# Patient Record
Sex: Female | Born: 1950 | Race: White | Hispanic: No | Marital: Married | Smoking: Never smoker
Health system: Southern US, Community
[De-identification: ages and names within clinical notes are randomized; demographics above are authoritative.]

## PROBLEM LIST (undated history)

## (undated) DIAGNOSIS — T7840XA Allergy, unspecified, initial encounter: Secondary | ICD-10-CM

## (undated) DIAGNOSIS — I10 Essential (primary) hypertension: Secondary | ICD-10-CM

## (undated) HISTORY — DX: Allergy, unspecified, initial encounter: T78.40XA

## (undated) HISTORY — PX: BREAST BIOPSY: SHX20

## (undated) HISTORY — DX: Essential (primary) hypertension: I10

## (undated) HISTORY — PX: OTHER SURGICAL HISTORY: SHX169

## (undated) HISTORY — PX: COLONOSCOPY: SHX174

---

## 2005-01-22 ENCOUNTER — Encounter: Admission: RE | Admit: 2005-01-22 | Discharge: 2005-01-22 | Payer: Self-pay | Admitting: Family Medicine

## 2014-10-31 ENCOUNTER — Ambulatory Visit (INDEPENDENT_AMBULATORY_CARE_PROVIDER_SITE_OTHER): Payer: BC Managed Care – PPO | Admitting: Internal Medicine

## 2014-10-31 ENCOUNTER — Encounter: Payer: Self-pay | Admitting: Internal Medicine

## 2014-10-31 VITALS — BP 110/60 | HR 66 | Temp 98.6°F | Resp 16 | Ht 64.0 in | Wt 134.6 lb

## 2014-10-31 DIAGNOSIS — Z23 Encounter for immunization: Secondary | ICD-10-CM

## 2014-10-31 DIAGNOSIS — Z299 Encounter for prophylactic measures, unspecified: Secondary | ICD-10-CM

## 2014-10-31 DIAGNOSIS — Z0000 Encounter for general adult medical examination without abnormal findings: Secondary | ICD-10-CM

## 2014-10-31 DIAGNOSIS — Z418 Encounter for other procedures for purposes other than remedying health state: Secondary | ICD-10-CM | POA: Diagnosis not present

## 2014-10-31 NOTE — Progress Notes (Signed)
   Subjective:    Patient ID: Kristi Alvarado, female    DOB: 04-21-1951, 64 y.o.   MRN: [id number]  HPI The patient is a 64 YO female coming in for wellness. No significant PMH. Non-smoker and runs 2-4 miles per day.  [facility], [facility] reviewed and updated with the patient.   Review of Systems  Constitutional: Negative for fever, activity change, appetite change, fatigue and unexpected weight change.  HENT: Negative.   Eyes: Negative.   Respiratory: Negative for cough, chest tightness, shortness of breath and wheezing.   Cardiovascular: Negative for chest pain, palpitations and leg swelling.  Gastrointestinal: Negative for abdominal pain, diarrhea, constipation and abdominal distention.  Musculoskeletal: Negative.   Skin: Negative.   Neurological: Negative.   Psychiatric/Behavioral: Negative.       Objective:   Physical Exam  Constitutional: She is oriented to person, place, and time. She appears well-developed and well-nourished.  HENT:  Head: Normocephalic and atraumatic.  Eyes: EOM are normal.  Neck: Normal range of motion.  Cardiovascular: Normal rate and regular rhythm.   Carotids without bruits  Pulmonary/Chest: Effort normal and breath sounds normal. No respiratory distress. She has no wheezes. She has no rales.  Abdominal: Soft. Bowel sounds are normal. She exhibits no distension. There is no tenderness. There is no rebound.  Musculoskeletal: She exhibits no edema.  Neurological: She is alert and oriented to person, place, and time. Coordination normal.  Skin: Skin is warm and dry.  Psychiatric: She has a normal mood and affect.   Filed Vitals:   10/31/14 1403  BP: 110/60  Pulse: 66  Temp: 98.6 F (37 C)  TempSrc: Oral  Resp: 16  Height: 5\' 4"  (1.626 m)  Weight: 134 lb 9.6 oz (61.054 kg)  SpO2: 96%      Assessment & Plan:  Shingles shot given at visit.

## 2014-10-31 NOTE — Progress Notes (Signed)
Pre visit review using our clinic review tool, if applicable. No additional management support is needed unless otherwise documented below in the visit note. 

## 2014-10-31 NOTE — Assessment & Plan Note (Signed)
Up to date on mammogram, pap smear, colonoscopy. No significant PMH. Weight good and exercises regularly. Non-smoker. Talked about sun protection and see dermatology yearly for skin inspection. Hearing and vision fairly good. She wears contacts and is getting a hearing test soon.

## 2014-10-31 NOTE — Patient Instructions (Signed)
We have given you the shingles shot today. We will get your records and let you know if we need you to come back for any blood work.  Come back in about 1 year for a physical.   Health Maintenance Adopting a healthy lifestyle and getting preventive care can go a long way to promote health and wellness. Talk with your health care provider about what schedule of regular examinations is right for you. This is a good chance for you to check in with your provider about disease prevention and staying healthy. In between checkups, there are plenty of things you can do on your own. Experts have done a lot of research about which lifestyle changes and preventive measures are most likely to keep you healthy. Ask your health care provider for more information. WEIGHT AND DIET  Eat a healthy diet  Be sure to include plenty of vegetables, fruits, low-fat dairy products, and lean protein.  Do not eat a lot of foods high in solid fats, added sugars, or salt.  Get regular exercise. This is one of the most important things you can do for your health.  Most adults should exercise for at least 150 minutes each week. The exercise should increase your heart rate and make you sweat (moderate-intensity exercise).  Most adults should also do strengthening exercises at least twice a week. This is in addition to the moderate-intensity exercise.  Maintain a healthy weight  Body mass index (BMI) is a measurement that can be used to identify possible weight problems. It estimates body fat based on height and weight. Your health care provider can help determine your BMI and help you achieve or maintain a healthy weight.  For females 67 years of age and older:   A BMI below 18.5 is considered underweight.  A BMI of 18.5 to 24.9 is normal.  A BMI of 25 to 29.9 is considered overweight.  A BMI of 30 and above is considered obese.  Watch levels of cholesterol and blood lipids  You should start having your blood  tested for lipids and cholesterol at 63 years of age, then have this test every 5 years.  You may need to have your cholesterol levels checked more often if:  Your lipid or cholesterol levels are high.  You are older than 63 years of age.  You are at high risk for heart disease.  CANCER SCREENING   Lung Cancer  Lung cancer screening is recommended for adults 15-46 years old who are at high risk for lung cancer because of a history of smoking.  A yearly low-dose CT scan of the lungs is recommended for people who:  Currently smoke.  Have quit within the past 15 years.  Have at least a 30-pack-year history of smoking. A pack year is smoking an average of one pack of cigarettes a day for 1 year.  Yearly screening should continue until it has been 15 years since you quit.  Yearly screening should stop if you develop a health problem that would prevent you from having lung cancer treatment.  Breast Cancer  Practice breast self-awareness. This means understanding how your breasts normally appear and feel.  It also means doing regular breast self-exams. Let your health care provider know about any changes, no matter how small.  If you are in your 20s or 30s, you should have a clinical breast exam (CBE) by a health care provider every 1-3 years as part of a regular health exam.  If you are  74 or older, have a CBE every year. Also consider having a breast X-ray (mammogram) every year.  If you have a family history of breast cancer, talk to your health care provider about genetic screening.  If you are at high risk for breast cancer, talk to your health care provider about having an MRI and a mammogram every year.  Breast cancer gene (BRCA) assessment is recommended for women who have family members with BRCA-related cancers. BRCA-related cancers include:  Breast.  Ovarian.  Tubal.  Peritoneal cancers.  Results of the assessment will determine the need for genetic counseling  and BRCA1 and BRCA2 testing. Cervical Cancer Routine pelvic examinations to screen for cervical cancer are no longer recommended for nonpregnant women who are considered low risk for cancer of the pelvic organs (ovaries, uterus, and vagina) and who do not have symptoms. A pelvic examination may be necessary if you have symptoms including those associated with pelvic infections. Ask your health care provider if a screening pelvic exam is right for you.   The Pap test is the screening test for cervical cancer for women who are considered at risk.  If you had a hysterectomy for a problem that was not cancer or a condition that could lead to cancer, then you no longer need Pap tests.  If you are older than 65 years, and you have had normal Pap tests for the past 10 years, you no longer need to have Pap tests.  If you have had past treatment for cervical cancer or a condition that could lead to cancer, you need Pap tests and screening for cancer for at least 20 years after your treatment.  If you no longer get a Pap test, assess your risk factors if they change (such as having a new sexual partner). This can affect whether you should start being screened again.  Some women have medical problems that increase their chance of getting cervical cancer. If this is the case for you, your health care provider may recommend more frequent screening and Pap tests.  The human papillomavirus (HPV) test is another test that may be used for cervical cancer screening. The HPV test looks for the virus that can cause cell changes in the cervix. The cells collected during the Pap test can be tested for HPV.  The HPV test can be used to screen women 53 years of age and older. Getting tested for HPV can extend the interval between normal Pap tests from three to five years.  An HPV test also should be used to screen women of any age who have unclear Pap test results.  After 63 years of age, women should have HPV testing  as often as Pap tests.  Colorectal Cancer  This type of cancer can be detected and often prevented.  Routine colorectal cancer screening usually begins at 63 years of age and continues through 63 years of age.  Your health care provider may recommend screening at an earlier age if you have risk factors for colon cancer.  Your health care provider may also recommend using home test kits to check for hidden blood in the stool.  A small camera at the end of a tube can be used to examine your colon directly (sigmoidoscopy or colonoscopy). This is done to check for the earliest forms of colorectal cancer.  Routine screening usually begins at age 86.  Direct examination of the colon should be repeated every 5-10 years through 63 years of age. However, you may need  to be screened more often if early forms of precancerous polyps or small growths are found. Skin Cancer  Check your skin from head to toe regularly.  Tell your health care provider about any new moles or changes in moles, especially if there is a change in a mole's shape or color.  Also tell your health care provider if you have a mole that is larger than the size of a pencil eraser.  Always use sunscreen. Apply sunscreen liberally and repeatedly throughout the day.  Protect yourself by wearing long sleeves, pants, a wide-brimmed hat, and sunglasses whenever you are outside. HEART DISEASE, DIABETES, AND HIGH BLOOD PRESSURE   Have your blood pressure checked at least every 1-2 years. High blood pressure causes heart disease and increases the risk of stroke.  If you are between 20 years and 65 years old, ask your health care provider if you should take aspirin to prevent strokes.  Have regular diabetes screenings. This involves taking a blood sample to check your fasting blood sugar level.  If you are at a normal weight and have a low risk for diabetes, have this test once every three years after 63 years of age.  If you are  overweight and have a high risk for diabetes, consider being tested at a younger age or more often. PREVENTING INFECTION  Hepatitis B  If you have a higher risk for hepatitis B, you should be screened for this virus. You are considered at high risk for hepatitis B if:  You were born in a country where hepatitis B is common. Ask your health care provider which countries are considered high risk.  Your parents were born in a high-risk country, and you have not been immunized against hepatitis B (hepatitis B vaccine).  You have HIV or AIDS.  You use needles to inject street drugs.  You live with someone who has hepatitis B.  You have had sex with someone who has hepatitis B.  You get hemodialysis treatment.  You take certain medicines for conditions, including cancer, organ transplantation, and autoimmune conditions. Hepatitis C  Blood testing is recommended for:  Everyone born from [birth year] through 1964.  Anyone with known risk factors for hepatitis C. Sexually transmitted infections (STIs)  You should be screened for sexually transmitted infections (STIs) including gonorrhea and chlamydia if:  You are sexually active and are younger than 63 years of age.  You are older than 63 years of age and your health care provider tells you that you are at risk for this type of infection.  Your sexual activity has changed since you were last screened and you are at an increased risk for chlamydia or gonorrhea. Ask your health care provider if you are at risk.  If you do not have HIV, but are at risk, it may be recommended that you take a prescription medicine daily to prevent HIV infection. This is called pre-exposure prophylaxis (PrEP). You are considered at risk if:  You are sexually active and do not regularly use condoms or know the HIV status of your partner(s).  You take drugs by injection.  You are sexually active with a partner who has HIV. Talk with your health care provider  about whether you are at high risk of being infected with HIV. If you choose to begin PrEP, you should first be tested for HIV. You should then be tested every 3 months for as long as you are taking PrEP.  PREGNANCY   If you are premenopausal  and you may become pregnant, ask your health care provider about preconception counseling.  If you may become pregnant, take 400 to 800 micrograms (mcg) of folic acid every day.  If you want to prevent pregnancy, talk to your health care provider about birth control (contraception). OSTEOPOROSIS AND MENOPAUSE   Osteoporosis is a disease in which the bones lose minerals and strength with aging. This can result in serious bone fractures. Your risk for osteoporosis can be identified using a bone density scan.  If you are 75 years of age or older, or if you are at risk for osteoporosis and fractures, ask your health care provider if you should be screened.  Ask your health care provider whether you should take a calcium or vitamin D supplement to lower your risk for osteoporosis.  Menopause may have certain physical symptoms and risks.  Hormone replacement therapy may reduce some of these symptoms and risks. Talk to your health care provider about whether hormone replacement therapy is right for you.  HOME CARE INSTRUCTIONS   Schedule regular health, dental, and eye exams.  Stay current with your immunizations.   Do not use any tobacco products including cigarettes, chewing tobacco, or electronic cigarettes.  If you are pregnant, do not drink alcohol.  If you are breastfeeding, limit how much and how often you drink alcohol.  Limit alcohol intake to no more than 1 drink per day for nonpregnant women. One drink equals 12 ounces of beer, 5 ounces of wine, or 1 ounces of hard liquor.  Do not use street drugs.  Do not share needles.  Ask your health care provider for help if you need support or information about quitting drugs.  Tell your  health care provider if you often feel depressed.  Tell your health care provider if you have ever been abused or do not feel safe at home. Document Released: 12/07/2010 Document Revised: 10/08/2013 Document Reviewed: 04/25/2013 [facility] Patient Information 2015 [place], [place]. This information is not intended to replace advice given to you by your health care provider. Make sure you discuss any questions you have with your health care provider.

## 2014-11-18 ENCOUNTER — Other Ambulatory Visit: Payer: Self-pay | Admitting: Obstetrics and Gynecology

## 2014-11-18 DIAGNOSIS — Z8249 Family history of ischemic heart disease and other diseases of the circulatory system: Secondary | ICD-10-CM

## 2014-11-22 ENCOUNTER — Other Ambulatory Visit: Payer: BC Managed Care – PPO

## 2014-12-18 ENCOUNTER — Ambulatory Visit
Admission: RE | Admit: 2014-12-18 | Discharge: 2014-12-18 | Disposition: A | Discharge status: Home / Self Care | Payer: BC Managed Care – PPO | Source: Ambulatory Visit | Attending: Obstetrics and Gynecology | Admitting: Obstetrics and Gynecology

## 2014-12-18 DIAGNOSIS — Z8249 Family history of ischemic heart disease and other diseases of the circulatory system: Secondary | ICD-10-CM

## 2015-01-03 ENCOUNTER — Telehealth: Payer: Self-pay | Admitting: Internal Medicine

## 2015-01-03 NOTE — Telephone Encounter (Signed)
Received records from [facility] forwarded 4 pages to Dr. Genella Mech 01/03/15 fbg.

## 2015-09-25 ENCOUNTER — Encounter: Payer: Self-pay | Admitting: Family

## 2015-09-25 ENCOUNTER — Ambulatory Visit (INDEPENDENT_AMBULATORY_CARE_PROVIDER_SITE_OTHER): Payer: BC Managed Care – PPO | Admitting: Family

## 2015-09-25 VITALS — BP 128/76 | HR 73 | Temp 98.5°F | Resp 16 | Ht 64.0 in | Wt 135.0 lb

## 2015-09-25 DIAGNOSIS — IMO0002 Reserved for concepts with insufficient information to code with codable children: Secondary | ICD-10-CM | POA: Insufficient documentation

## 2015-09-25 DIAGNOSIS — L988 Other specified disorders of the skin and subcutaneous tissue: Secondary | ICD-10-CM | POA: Diagnosis not present

## 2015-09-25 NOTE — Progress Notes (Signed)
Subjective:    Patient ID: Kristi Alvarado, female    DOB: April 18, 1951, 65 y.o.   MRN: [id number]  Chief Complaint  Patient presents with  . hand issues    has a hard place in her right hand that she noticed last week, does not hurt but is tight when she makes a fist    HPI:  Kristi Alvarado is a 65 y.o. female who  has a past medical history of Allergy. and presents today for an acute office visit.  This is a new problem. Associated symptom of a knot located on her right hand located on the fourth finger that has been going on for about 1 week. Described as tight when she makes a fist but denies pain. No trauma to the area. Denies modifying factors that make it better or worse. No numbness or tingling. Course of the symptoms appear to have spread a little bit.  No Known Allergies   Current Outpatient Prescriptions on File Prior to Visit  Medication Sig Dispense Refill  . Omega-3 Fatty Acids (OMEGA 3 PO) Take by mouth.     No current facility-administered medications on file prior to visit.    Review of Systems  Constitutional: Negative for fever and chills.  Musculoskeletal:       Positive for a right hand knot.  Neurological: Negative for weakness and numbness.      Objective:    BP 128/76 mmHg  Pulse 73  Temp(Src) 98.5 F (36.9 C) (Oral)  Resp 16  Ht  (1.626 m)  Wt 135 lb (61.236 kg)  BMI 23.16 kg/m2  SpO2 97% Nursing note and vital signs reviewed.  Physical Exam  Constitutional: She is oriented to person, place, and time. She appears well-developed and well-nourished. No distress.  Cardiovascular: Normal rate, regular rhythm, normal heart sounds and intact distal pulses.   Pulmonary/Chest: Effort normal and breath sounds normal.  Musculoskeletal:  Right hand - no obvious deformity, discoloration, or edema noted. Tenderness able to be elicited with palpable mass located on/near the common flexor tendon of the fourth metacarpal. Distal pulses and capillary  refill are intact and appropriate. Range of motion is normal.  Neurological: She is alert and oriented to person, place, and time.  Skin: Skin is warm and dry.  Psychiatric: She has a normal mood and affect. Her behavior is normal. Judgment and thought content normal.    Examination: Ultrasound of hand and wrist  Date:  09/25/2015 Patient Name: Kristi Alvarado History: Palpable mass of 4th finger Findings:  Median nerve is unremarkable in appearance. No evidence of Tina synovitis. The radial carpal, metacarpal, and distal radioulnar joint are normal without effusion or synovial hypertrophy. The wrist tendons are normal without tear or tenosynovitis. Normal dorsal compartment of the scapholunate ligament. No dorsal or volar ganglion cyst. A cyst located adjacent to the flexor tendon of the fourth metacarpal as noted. Unremarkable Guyon canal.     Impression:  Fluid filled cyst located adjacent to 4th metacarpal flexor tendon with no evidence of connection the the tendon.   Full images located under the media tab.  Ultrasound ordered, performed and interpreted by Marcos Eke, FNP.   Procedure:  Ultrasound guided injection is preferred based studies that show increased duration, increased effect, greater accuracy, decreased procedural pain, increased response rate, and decreased cost with ultrasound guided versus blind injection.  Verbal informed consent obtained.  Time-out conducted.  Noted no overlying erythema, induration, or other signs of local infection.  Skin prepped in a sterile fashion.  Local anesthesia: Topical Ethyl chloride.  With sterile technique and under real time ultrasound guidance: needle advanced into the fluid filled cyst seen distending under real time ultrasound guidance. 1 cc of Sensoricain was administered and the cyst was drained. Completed without difficulty  Pain immediately resolved suggesting accurate placement of the medication.  Advised to call if  fevers/chills, erythema, induration, drainage, or persistent bleeding.    _            Assessment & Plan:   Problem List Items Addressed This Visit      Other   Cyst of finger - Primary    Ultrasound reveals fluid filled cyst adjacent to the 4th digit common flexor tendon. Cyst drained without complication and relief was experienced upon completion. Post care instructions provided and signs of infection and when to return. Follow up if symptoms return.       Relevant Orders   US Extrem Up Right Ltd      I am having Ms. Angert maintain her Omega-3 Fatty Acids (OMEGA 3 PO).   Follow-up: No Follow-up on file.  Jeanine Luzalone, Jacqualynn Parco, FNP

## 2015-09-25 NOTE — Assessment & Plan Note (Signed)
Ultrasound reveals fluid filled cyst adjacent to the 4th digit common flexor tendon. Cyst drained without complication and relief was experienced upon completion. Post care instructions provided and signs of infection and when to return. Follow up if symptoms return.

## 2015-09-25 NOTE — Patient Instructions (Signed)
Thank you for choosing [facility].  Summary/Instructions:  Please keep clean with soap and water.  Monitor for signs of infection.    If your symptoms worsen or fail to improve, please contact our office for further instruction, or in case of emergency go directly to the emergency room at the closest medical facility.

## 2015-09-25 NOTE — Progress Notes (Signed)
Pre visit review using our clinic review tool, if applicable. No additional management support is needed unless otherwise documented below in the visit note. 

## 2015-10-30 ENCOUNTER — Encounter: Payer: BC Managed Care – PPO | Admitting: Internal Medicine

## 2015-11-11 ENCOUNTER — Ambulatory Visit (INDEPENDENT_AMBULATORY_CARE_PROVIDER_SITE_OTHER): Payer: BC Managed Care – PPO | Admitting: Family

## 2015-11-11 ENCOUNTER — Encounter: Payer: Self-pay | Admitting: Family

## 2015-11-11 VITALS — BP 108/80 | HR 54 | Temp 97.7°F | Resp 16 | Ht 64.0 in | Wt 135.1 lb

## 2015-11-11 DIAGNOSIS — IMO0002 Reserved for concepts with insufficient information to code with codable children: Secondary | ICD-10-CM

## 2015-11-11 DIAGNOSIS — L988 Other specified disorders of the skin and subcutaneous tissue: Secondary | ICD-10-CM | POA: Diagnosis not present

## 2015-11-11 NOTE — Assessment & Plan Note (Signed)
Cyst return from previous drainage with successful repeat drainage and cortisone injection placed. Follow up and return precautions provided. If symptoms return will consider referral to hand specialist.

## 2015-11-11 NOTE — Progress Notes (Signed)
Subjective:    Patient ID: Kristi Alvarado, female    DOB: 07/19/1950, 65 y.o.   MRN: [id number]  Chief Complaint  Patient presents with  . Cyst    had a cyst removed from her right hand in april and it has come back    HPI:  Kristi Alvarado is a 65 y.o. female who  has a past medical history of Allergy. and presents today for a follow up office visit.   Previously noted to have a cyst adjacent to her 4th digit that was drained. Reports that the same cyst has returned. Describes that she was recently driving when she noted a similar tightness in her hand consistent with the previously drained cyst. Denies pain. There is some restricted range of motion.   No Known Allergies   Current Outpatient Prescriptions on File Prior to Visit  Medication Sig Dispense Refill  . Omega-3 Fatty Acids (OMEGA 3 PO) Take by mouth.     No current facility-administered medications on file prior to visit.     Review of Systems  Constitutional: Negative for fever and chills.  Musculoskeletal:       Positive for cyst of right hand.      Objective:    BP 108/80 mmHg  Pulse 54  Temp(Src) 97.7 F (36.5 C) (Oral)  Resp 16  Ht  (1.626 m)  Wt 135 lb 1.6 oz (61.281 kg)  BMI 23.18 kg/m2  SpO2 99% Nursing note and vital signs reviewed.  Physical Exam  Constitutional: She is oriented to person, place, and time. She appears well-developed and well-nourished. No distress.  Cardiovascular: Normal rate, regular rhythm, normal heart sounds and intact distal pulses.   Pulmonary/Chest: Effort normal and breath sounds normal.  Musculoskeletal:  Right hand - no obvious deformity, discoloration, or edema noted. Tenderness able to be elicited with palpable mass located on/near the common flexor tendon of the fourth metacarpal. Distal pulses and capillary refill are intact and appropriate. Range of motion is normal.   Neurological: She is alert and oriented to person, place, and time.  Skin: Skin is  warm and dry.  Psychiatric: She has a normal mood and affect. Her behavior is normal. Judgment and thought content normal.   Procedure: Cyst drainage and injection  Description: Informed consent was obtained with discussion including risks and benefits of the procedure. Patient verbally wished to continued. Time out was performed. The site was marked and identified. It was cleansed with betadine using a concentric circular pattern. Skin anesthesia was applied using cold spray applied for 10 seconds. Needle was advanced into the cyst and injection of Marcaine was provided for anesthesia. The cyst was  Then drained without difficulty and 0.5 ml of triamcinolone was injected. Following the injection there was instant relief confirming appropriate placement. A bandage was applied to the area and post care instructions were provided. The procedure was tolerated well with no complications.      Assessment & Plan:   Problem List Items Addressed This Visit      Other   Cyst of finger - Primary    Cyst return from previous drainage with successful repeat drainage and cortisone injection placed. Follow up and return precautions provided. If symptoms return will consider referral to hand specialist.           I am having Ms. Montoya maintain her Omega-3 Fatty Acids (OMEGA 3 PO) and glucosamine-chondroitin.   Meds ordered this encounter  Medications  . glucosamine-chondroitin 500-400 MG  tablet    Sig: Take 1 tablet by mouth 3 (three) times daily.     Follow-up: Return if symptoms worsen or fail to improve.  Jeanine Luzalone, Gregory, FNP

## 2015-11-11 NOTE — Patient Instructions (Signed)
Thank you for choosing [facility].  Summary/Instructions:  Continue to monitor for signs of infection.  Keep the slight clean dry and intact.   Tylenol and ice as needed for discomfort.   If your symptoms worsen or fail to improve, please contact our office for further instruction, or in case of emergency go directly to the emergency room at the closest medical facility.

## 2015-11-11 NOTE — Progress Notes (Signed)
Pre visit review using our clinic review tool, if applicable. No additional management support is needed unless otherwise documented below in the visit note. 

## 2016-03-22 ENCOUNTER — Encounter: Payer: Self-pay | Admitting: Internal Medicine

## 2016-03-22 ENCOUNTER — Ambulatory Visit (INDEPENDENT_AMBULATORY_CARE_PROVIDER_SITE_OTHER): Payer: BC Managed Care – PPO | Admitting: Internal Medicine

## 2016-03-22 ENCOUNTER — Other Ambulatory Visit (INDEPENDENT_AMBULATORY_CARE_PROVIDER_SITE_OTHER): Payer: BC Managed Care – PPO

## 2016-03-22 VITALS — BP 132/66 | HR 59 | Temp 98.1°F | Resp 16 | Ht 64.0 in | Wt 138.0 lb

## 2016-03-22 DIAGNOSIS — Z1159 Encounter for screening for other viral diseases: Secondary | ICD-10-CM | POA: Diagnosis not present

## 2016-03-22 DIAGNOSIS — Z0000 Encounter for general adult medical examination without abnormal findings: Secondary | ICD-10-CM | POA: Diagnosis not present

## 2016-03-22 DIAGNOSIS — M542 Cervicalgia: Secondary | ICD-10-CM

## 2016-03-22 DIAGNOSIS — Z23 Encounter for immunization: Secondary | ICD-10-CM | POA: Diagnosis not present

## 2016-03-22 LAB — CBC
HCT: 41.6 % (ref 36.0–46.0)
HEMOGLOBIN: 14.1 g/dL (ref 12.0–15.0)
MCHC: 33.7 g/dL (ref 30.0–36.0)
MCV: 89.6 fl (ref 78.0–100.0)
PLATELETS: 285 10*3/uL (ref 150.0–400.0)
RBC: 4.65 Mil/uL (ref 3.87–5.11)
RDW: 12.3 % (ref 11.5–15.5)
WBC: 9.3 10*3/uL (ref 4.0–10.5)

## 2016-03-22 LAB — LIPID PANEL
Cholesterol: 173 mg/dL (ref 0–200)
HDL: 57.4 mg/dL (ref >39.00)
LDL Cholesterol: 89 mg/dL (ref 0–99)
NONHDL: 115.82
TRIGLYCERIDES: 132 mg/dL (ref 0.0–149.0)
Total CHOL/HDL Ratio: 3
VLDL: 26.4 mg/dL (ref 0.0–40.0)

## 2016-03-22 LAB — COMPREHENSIVE METABOLIC PANEL
ALK PHOS: 51 U/L (ref 39–117)
ALT: 15 U/L (ref 0–35)
AST: 21 U/L (ref 0–37)
Albumin: 4.6 g/dL (ref 3.5–5.2)
BILIRUBIN TOTAL: 0.7 mg/dL (ref 0.2–1.2)
BUN: 14 mg/dL (ref 6–23)
CALCIUM: 10 mg/dL (ref 8.4–10.5)
CO2: 32 mEq/L (ref 19–32)
Chloride: 102 mEq/L (ref 96–112)
Creatinine, Ser: 0.64 mg/dL (ref 0.40–1.20)
GFR: 99.15 mL/min (ref >60.00)
Glucose, Bld: 95 mg/dL (ref 70–99)
Potassium: 4.1 mEq/L (ref 3.5–5.1)
Sodium: 141 mEq/L (ref 135–145)
TOTAL PROTEIN: 7.3 g/dL (ref 6.0–8.3)

## 2016-03-22 NOTE — Patient Instructions (Signed)
We will check the labs today and send the results on mychart.   We have given you the tetanus shot today.   The pain is likely from a muscle and you can continue to use aleve or ibuprofen for the pain. You can also try heating pads.

## 2016-03-22 NOTE — Progress Notes (Signed)
   Subjective:    Patient ID: Kristi Alvarado, female    DOB: 1951/02/08, 65 y.o.   MRN: [id number]  HPI The patient is a 65 YO female coming in for sinus pain and pressure behind her right ear for several months off and on. She is taking flonase and zyrtec otc off and on for symptoms. The pain is worse with moving her neck and goes into the neck as well. Worse when waking up and then clears during the day. She has not tried anything for the pain. She denies fevers or chills. No sinus pressure. Minimal nasal symptoms. No ear pain or discharge. Some injury to her neck awhile ago. No numbness or weakness in her arms.   Review of Systems  Constitutional: Negative for activity change, appetite change, fatigue, fever and unexpected weight change.  HENT: Positive for rhinorrhea. Negative for congestion, ear discharge, ear pain, postnasal drip, sinus pressure, sore throat and trouble swallowing.   Eyes: Negative.   Respiratory: Negative.   Cardiovascular: Negative.   Gastrointestinal: Negative.   Musculoskeletal: Positive for neck pain. Negative for arthralgias, back pain, myalgias and neck stiffness.  Skin: Negative.       Objective:   Physical Exam  Constitutional: She is oriented to person, place, and time. She appears well-developed and well-nourished.  HENT:  Head: Normocephalic and atraumatic.  Right Ear: External ear normal.  Left Ear: External ear normal.  Oropharynx with mild erythema and clear drainage. No nasal crusting or sinus pressure.   Eyes: EOM are normal.  Neck: Normal range of motion. No JVD present.  Pain in the lateral neck and into the shoulder, no pain over the cervical region.   Cardiovascular: Normal rate and regular rhythm.   Pulmonary/Chest: Effort normal and breath sounds normal. No respiratory distress. She has no wheezes. She has no rales.  Abdominal: Soft.  Lymphadenopathy:    She has no cervical adenopathy.  Neurological: She is alert and oriented to person,  place, and time. Coordination normal.  Skin: Skin is warm and dry.   Vitals:   03/22/16 1502  BP: 132/66  Pulse: (!) 59  Resp: 16  Temp: 98.1 F (36.7 C)  TempSrc: Oral  SpO2: 98%  Weight: 138 lb (62.6 kg)  Height: 5\' 4"  (1.626 m)      Assessment & Plan:  Tdap given at visit.

## 2016-03-22 NOTE — Progress Notes (Signed)
Pre visit review using our clinic review tool, if applicable. No additional management support is needed unless otherwise documented below in the visit note. 

## 2016-03-23 ENCOUNTER — Ambulatory Visit: Payer: BC Managed Care – PPO | Admitting: Internal Medicine

## 2016-03-23 DIAGNOSIS — M542 Cervicalgia: Secondary | ICD-10-CM | POA: Insufficient documentation

## 2016-03-23 LAB — HEPATITIS C ANTIBODY: HCV Ab: NEGATIVE

## 2016-03-23 NOTE — Assessment & Plan Note (Signed)
Has not tried any otc meds and will have her try OTC NSAIDs. No indication for imaging today. Given exercises as well to help with flexibility.

## 2016-08-30 ENCOUNTER — Telehealth: Payer: Self-pay | Admitting: Family

## 2016-08-30 DIAGNOSIS — L989 Disorder of the skin and subcutaneous tissue, unspecified: Secondary | ICD-10-CM | POA: Insufficient documentation

## 2016-08-30 NOTE — Telephone Encounter (Signed)
Patient states when she last seen Marcos EkeGreg Calone he told her that if the cyst on her hand came back he would refer her to a hand specialist.  Patient is requesting referral.

## 2016-08-30 NOTE — Telephone Encounter (Signed)
Referral placed.

## 2016-08-31 NOTE — Telephone Encounter (Signed)
Left patient vm to notify °

## 2016-09-23 DIAGNOSIS — R2231 Localized swelling, mass and lump, right upper limb: Secondary | ICD-10-CM | POA: Insufficient documentation

## 2016-09-23 DIAGNOSIS — M79641 Pain in right hand: Secondary | ICD-10-CM | POA: Insufficient documentation

## 2016-12-23 ENCOUNTER — Encounter: Payer: Self-pay | Admitting: Obstetrics and Gynecology

## 2017-09-29 ENCOUNTER — Encounter: Payer: Self-pay | Admitting: Gastroenterology

## 2017-11-18 ENCOUNTER — Other Ambulatory Visit: Payer: Self-pay

## 2017-11-18 ENCOUNTER — Encounter: Payer: Self-pay | Admitting: Gastroenterology

## 2017-11-18 ENCOUNTER — Ambulatory Visit (AMBULATORY_SURGERY_CENTER): Payer: Self-pay | Admitting: *Deleted

## 2017-11-18 VITALS — Ht 64.0 in | Wt 137.0 lb

## 2017-11-18 DIAGNOSIS — Z1211 Encounter for screening for malignant neoplasm of colon: Secondary | ICD-10-CM

## 2017-11-18 MED ORDER — NA SULFATE-K SULFATE-MG SULF 17.5-3.13-1.6 GM/177ML PO SOLN: ORAL | 0 refills | Status: DC

## 2017-11-18 NOTE — Progress Notes (Signed)
Patient denies any allergies to eggs or soy. Patient denies any problems with anesthesia/sedation. Patient denies any oxygen use at home. Patient denies taking any diet/weight loss medications or blood thinners. EMMI education assisgned to patient on colonoscopy, this was explained and instructions given to patient. Suprep coupon given to pt.  

## 2017-12-02 ENCOUNTER — Encounter: Payer: Self-pay | Admitting: Gastroenterology

## 2017-12-02 ENCOUNTER — Other Ambulatory Visit: Payer: Self-pay

## 2017-12-02 ENCOUNTER — Ambulatory Visit (AMBULATORY_SURGERY_CENTER): Payer: BC Managed Care – PPO | Admitting: Gastroenterology

## 2017-12-02 VITALS — BP 133/72 | HR 64 | Temp 99.5°F | Resp 12 | Ht 64.0 in | Wt 138.0 lb

## 2017-12-02 DIAGNOSIS — Z1211 Encounter for screening for malignant neoplasm of colon: Secondary | ICD-10-CM | POA: Diagnosis present

## 2017-12-02 DIAGNOSIS — K5732 Diverticulitis of large intestine without perforation or abscess without bleeding: Secondary | ICD-10-CM | POA: Diagnosis not present

## 2017-12-02 MED ORDER — CIPROFLOXACIN HCL 500 MG PO TABS: 500.0000 mg | ORAL_TABLET | Freq: Two times a day (BID) | ORAL | 0 refills | Status: AC

## 2017-12-02 MED ORDER — METRONIDAZOLE 500 MG PO TABS: 500.0000 mg | ORAL_TABLET | Freq: Two times a day (BID) | ORAL | 0 refills | Status: DC

## 2017-12-02 MED ORDER — SODIUM CHLORIDE 0.9 % IV SOLN: 500.0000 mL | Freq: Once | INTRAVENOUS | Status: DC

## 2017-12-02 NOTE — Op Note (Signed)
[facility] Patient Name: Kristi Alvarado Procedure Date: 12/02/2017 8:52 AM MRN: [id number] Endoscopist: Napoleon Form , MD Age: 67 Referring MD:  Date of Birth: 30-Aug-1950 Gender: Female Account #: [id number] Procedure:                Colonoscopy Indications:              Screening for colorectal malignant neoplasm Medicines:                Monitored Anesthesia Care Procedure:                Pre-Anesthesia Assessment:                           - Prior to the procedure, a History and Physical                            was performed, and patient medications and                            allergies were reviewed. The patient's tolerance of                            previous anesthesia was also reviewed. The risks                            and benefits of the procedure and the sedation                            options and risks were discussed with the patient.                            All questions were answered, and informed consent                            was obtained. Prior Anticoagulants: The patient has                            taken no previous anticoagulant or antiplatelet                            agents. ASA Grade Assessment: II - A patient with                            mild systemic disease. After reviewing the risks                            and benefits, the patient was deemed in                            satisfactory condition to undergo the procedure.                           After obtaining informed consent, the colonoscope  was passed under direct vision. Throughout the                            procedure, the patient's blood pressure, pulse, and                            oxygen saturations were monitored continuously. The                            Colonoscope was introduced through the anus and                            advanced to the the cecum, identified by                            appendiceal orifice  and ileocecal valve. The                            patient tolerated the procedure well. The                            colonoscopy was technically difficult and complex                            due to multiple diverticula in the colon and                            restricted mobility of the colon. The quality of                            the bowel preparation was good. The ileocecal                            valve, appendiceal orifice, and rectum were                            photographed. Scope In: 8:55:32 AM Scope Out: 9:19:51 AM Scope Withdrawal Time: 0 hours 9 minutes 56 seconds  Total Procedure Duration: 0 hours 24 minutes 19 seconds  Findings:                 The perianal and digital rectal examinations were                            normal.                           Scattered small and large-mouthed diverticula were                            found in the sigmoid colon, descending colon,                            transverse colon, hepatic flexure, ascending colon  and cecum. There was narrowing of the colon in                            association with the diverticular opening. There                            was evidence of diverticular spasm.                            Peri-diverticular erythema was seen in sigmoid and                            descending colon. Purulent discharge was seen in                            association with the diverticular opening in                            sigmoid colon at 30cm, suspicious of diverticulitis.                           Non-bleeding internal hemorrhoids were found during                            retroflexion. The hemorrhoids were small.                           The exam was otherwise without abnormality. Complications:            No immediate complications. Estimated Blood Loss:     Estimated blood loss: none. Impression:               - Severe diverticulosis in the sigmoid colon, in                             the descending colon, in the transverse colon, at                            the hepatic flexure, in the ascending colon and in                            the cecum. There was narrowing of the colon in                            association with the diverticular opening. There                            was evidence of diverticular spasm.                            Peri-diverticular erythema was seen. Purulent                            discharge was seen in association with the  diverticular opening, suspicious of diverticulitis.                           - Non-bleeding internal hemorrhoids.                           - The examination was otherwise normal.                           - No specimens collected. Recommendation:           - Patient has a contact number available for                            emergencies. The signs and symptoms of potential                            delayed complications were discussed with the                            patient. Return to normal activities tomorrow.                            Written discharge instructions were provided to the                            patient.                           - Resume previous diet.                           - Continue present medications.                           - Repeat colonoscopy in 10 years for screening                            purposes.                           - Cipro (ciprofloxacin) 500 mg PO BID for 5 days.                           - Flagyl (metronidazole) 500 mg PO BID for 5 weeks. Napoleon Form, MD 12/02/2017 9:26:32 AM This report has been signed electronically.

## 2017-12-02 NOTE — Progress Notes (Signed)
To recovery, report to RN, VSS. 

## 2017-12-02 NOTE — Patient Instructions (Addendum)
YOU HAD AN ENDOSCOPIC PROCEDURE TODAY AT THE [facility]:   Refer to the procedure report that was given to you for any specific questions about what was found during the examination.  If the procedure report does not answer your questions, please call your gastroenterologist to clarify.  If you requested that your care partner not be given the details of your procedure findings, then the procedure report has been included in a sealed envelope for you to review at your convenience later.  YOU SHOULD EXPECT: Some feelings of bloating in the abdomen. Passage of more gas than usual.  Walking can help get rid of the air that was put into your GI tract during the procedure and reduce the bloating. If you had a lower endoscopy (such as a colonoscopy or flexible sigmoidoscopy) you may notice spotting of blood in your stool or on the toilet paper. If you underwent a bowel prep for your procedure, you may not have a normal bowel movement for a few days.  Please Note:  You might notice some irritation and congestion in your nose or some drainage.  This is from the oxygen used during your procedure.  There is no need for concern and it should clear up in a day or so.  SYMPTOMS TO REPORT IMMEDIATELY:   Following lower endoscopy (colonoscopy or flexible sigmoidoscopy):  Excessive amounts of blood in the stool  Significant tenderness or worsening of abdominal pains  Swelling of the abdomen that is new, acute  Fever of 100F or higher   For urgent or emergent issues, a gastroenterologist can be reached at any hour by calling [phone].   DIET:  We do recommend a small meal at first, but then you may proceed to your regular diet.  Drink plenty of fluids but you should avoid alcoholic beverages for 24 hours. Try to increase the fiber in your diet, and drink plenty of water.  ACTIVITY:  You should plan to take it easy for the rest of today and you should NOT DRIVE or use heavy machinery until  tomorrow (because of the sedation medicines used during the test).    FOLLOW UP: Our staff will call the number listed on your records the next business day following your procedure to check on you and address any questions or concerns that you may have regarding the information given to you following your procedure. If we do not reach you, we will leave a message.  However, if you are feeling well and you are not experiencing any problems, there is no need to return our call.  We will assume that you have returned to your regular daily activities without incident.  Read all of the handouts given to you by your recovery room nurse.  If any biopsies were taken you will be contacted by phone or by letter within the next 1-3 weeks.  Please call us at [phone] if you have not heard about the biopsies in 3 weeks.    SIGNATURES/CONFIDENTIALITY: You and/or your care partner have signed paperwork which will be entered into your electronic medical record.  These signatures attest to the fact that that the information above on your After Visit Summary has been reviewed and is understood.  Full responsibility of the confidentiality of this discharge information lies with you and/or your care-partner.  Be sure to read all of the handouts given to you by your recovery room nurse.  Take your new medication as directed.

## 2017-12-02 NOTE — Progress Notes (Signed)
I have reviewed the patient's medical history in detail and updated the computerized patient record.

## 2017-12-05 ENCOUNTER — Telehealth: Payer: Self-pay

## 2017-12-05 ENCOUNTER — Other Ambulatory Visit: Payer: Self-pay

## 2017-12-05 NOTE — Telephone Encounter (Signed)
  Follow up Call-  Call back number 12/02/2017  Post procedure Call Back phone  # [phone]  Permission to leave phone message Yes  Some recent data might be hidden     Patient questions:  Do you have a fever, pain , or abdominal swelling? No. Pain Score  0 *  Have you tolerated food without any problems? Yes.    Have you been able to return to your normal activities? Yes.    Do you have any questions about your discharge instructions: Diet   No. Medications  No. Follow up visit  No.  Do you have questions or concerns about your Care? No.  Actions: * If pain score is 4 or above: No action needed, pain <4.  Concerned because she is still having tarry stools.  Routing to Dr Verlin FesterKN

## 2017-12-06 NOTE — Telephone Encounter (Signed)
Called patient back. She is having semi formed dark green stool. Today is Day 5 of Cipro and Flagyl. Patient says she has plenty of flagyl left, advised her to stop both after 5 days use. Start Probiotic Align daily for 2-4 weeks. Call with any changes.

## 2017-12-06 NOTE — Telephone Encounter (Signed)
Noted  

## 2017-12-13 NOTE — Telephone Encounter (Signed)
Follow-up [facility] Marylene LandAngela

## 2017-12-16 NOTE — Telephone Encounter (Signed)
No message

## 2018-01-27 ENCOUNTER — Encounter: Payer: Self-pay | Admitting: Internal Medicine

## 2018-01-27 ENCOUNTER — Ambulatory Visit: Payer: BC Managed Care – PPO | Admitting: Internal Medicine

## 2018-01-27 VITALS — BP 130/80 | HR 69 | Temp 98.5°F | Ht 64.0 in | Wt 136.0 lb

## 2018-01-27 DIAGNOSIS — M25562 Pain in left knee: Secondary | ICD-10-CM | POA: Insufficient documentation

## 2018-01-27 DIAGNOSIS — Z23 Encounter for immunization: Secondary | ICD-10-CM | POA: Diagnosis not present

## 2018-01-27 MED ORDER — PREDNISONE 20 MG PO TABS: 40.0000 mg | ORAL_TABLET | Freq: Every day | ORAL | 0 refills | Status: DC

## 2018-01-27 MED ORDER — DICLOFENAC SODIUM 1 % TD GEL: 2.0000 g | Freq: Four times a day (QID) | TRANSDERMAL | 3 refills | Status: DC

## 2018-01-27 NOTE — Progress Notes (Signed)
   Subjective:    Patient ID: Kristi Alvarado, female    DOB: Jan 23, 1951, 67 y.o.   MRN: [id number]  HPI The patient is a 67 YO female coming in for left knee pain. 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 She is wearing knee compression sleeve. She is taking advil and icing her knee and this has helped some. Started Tuesday and is it still hurting pretty good. She has stopped running since the pain started. She had some swelling initially which has improved but some swelling still present. Using compression sleeve on her knee. Denies injury or trip or fall recently. No major training differences recently. Did have an injury after colonoscopy in June which she rehabilitated fine.   Review of Systems  Constitutional: Positive for activity change. Negative for appetite change, chills, fatigue, fever and unexpected weight change.  Respiratory: Negative.   Cardiovascular: Negative.   Gastrointestinal: Negative.   Musculoskeletal: Positive for arthralgias, joint swelling and myalgias. Negative for back pain and gait problem.  Skin: Negative.   Neurological: Negative.       Objective:   Physical Exam  Constitutional: She is oriented to person, place, and time. She appears well-developed and well-nourished.  HENT:  Head: Normocephalic and atraumatic.  Eyes: EOM are normal.  Neck: Normal range of motion.  Cardiovascular: Normal rate and regular rhythm.  Pulmonary/Chest: Effort normal and breath sounds normal. No respiratory distress. She has no wheezes. She has no rales.  Musculoskeletal: She exhibits edema and tenderness.  Mild effusion left knee, ACL and PCL intact. Pain over the lateral knee with swelling and pain on palpation. ROM normal.   Neurological: She is alert and oriented to person, place, and time. Coordination normal.  Skin: Skin is warm and dry.   Vitals:   01/27/18 1532  BP: 130/80  Pulse: 69  Temp: 98.5 F (36.9 C)  TempSrc: Oral  SpO2: 95%  Weight: 136 lb (61.7  kg)  Height: 5\' 4"  (1.626 m)      Assessment & Plan:  Prevnar 13 and shingrix IM given at visit

## 2018-01-27 NOTE — Patient Instructions (Signed)
We have sent in prednisone to take 2 pills daily for 4 days.  We have sent in gel medicine to use for pain up to 4 times per day.    Iliotibial Band Syndrome Rehab Ask your health care provider which exercises are safe for you. Do exercises exactly as told by your health care provider and adjust them as directed. It is normal to feel mild stretching, pulling, tightness, or discomfort as you do these exercises, but you should stop right away if you feel sudden pain or your pain gets worse.Do not begin these exercises until told by your health care provider. Stretching and range of motion exercises These exercises warm up your muscles and joints and improve the movement and flexibility of your hip and pelvis. Exercise A: Quadriceps, prone  1. Lie on your abdomen on a firm surface, such as a bed or padded floor. 2. Bend your left / right knee and hold your ankle. If you cannot reach your ankle or pant leg, loop a belt around your foot and grab the belt instead. 3. Gently pull your heel toward your buttocks. Your knee should not slide out to the side. You should feel a stretch in the front of your thigh and knee. 4. Hold this position for __________ seconds. Repeat __________ times. Complete this stretch __________ times a day. Exercise B: Iliotibial band  1. Lie on your side with your left / right leg in the top position. 2. Bend both of your knees and grab your left / right ankle. Stretch out your bottom arm to help you balance. 3. Slowly bring your top knee back so your thigh goes behind your trunk. 4. Slowly lower your top leg toward the floor until you feel a gentle stretch on the outside of your left / right hip and thigh. If you do not feel a stretch and your knee will not fall farther, place the heel of your other foot on top of your knee and pull your knee down toward the floor with your foot. 5. Hold this position for __________ seconds. Repeat __________ times. Complete this stretch  __________ times a day. Strengthening exercises These exercises build strength and endurance in your hip and pelvis. Endurance is the ability to use your muscles for a long time, even after they get tired. Exercise C: Straight leg raises ( hip abductors) 1. Lie on your side with your left / right leg in the top position. Lie so your head, shoulder, knee, and hip line up. You may bend your bottom knee to help you balance. 2. Roll your hips slightly forward so your hips are stacked directly over each other and your left / right knee is facing forward. 3. Tense the muscles in your outer thigh and lift your top leg 4-6 inches (10-15 cm). 4. Hold this position for __________ seconds. 5. Slowly return to the starting position. Let your muscles relax completely before doing another repetition. Repeat __________ times. Complete this exercise __________ times a day. Exercise D: Straight leg raises ( hip extensors) 1. Lie on your abdomen on your bed or a firm surface. You can put a pillow under your hips if that is more comfortable. 2. Bend your left / right knee so your foot is straight up in the air. 3. Squeeze your buttock muscles and lift your left / right thigh off the bed. Do not let your back arch. 4. Tense this muscle as hard as you can without increasing any knee pain. 5. Hold  this position for __________ seconds. 6. Slowly lower your leg to the starting position and allow it to relax completely. Repeat __________ times. Complete this exercise __________ times a day. Exercise E: Hip hike 1. Stand sideways on a bottom step. Stand on your left / right leg with your other foot unsupported next to the step. You can hold onto the railing or wall if needed for balance. 2. Keep your knees straight and your torso square. Then, lift your left / right hip up toward the ceiling. 3. Slowly let your left / right hip lower toward the floor, past the starting position. Your foot should get closer to the  floor. Do not lean or bend your knees. Repeat __________ times. Complete this exercise __________ times a day. This information is not intended to replace advice given to you by your health care provider. Make sure you discuss any questions you have with your health care provider. Document Released: 05/24/2005 Document Revised: 01/27/2016 Document Reviewed: 04/25/2015 Elsevier Interactive Patient Education  [employer]2018 [employer]

## 2018-01-27 NOTE — Assessment & Plan Note (Signed)
Given stretching and advice given for exercise regimen. Rx for prednisone and voltaren gel for the pain. Can continue with ice and compression sleeve.

## 2018-01-31 ENCOUNTER — Telehealth: Payer: Self-pay | Admitting: Internal Medicine

## 2018-01-31 NOTE — Telephone Encounter (Signed)
Contacted patient and pharmacy about Gel, pharmacy stated there was a PA on file but it was 22.04 without PA and they can fill it for patient. Patient informed and stated understanding

## 2018-01-31 NOTE — Telephone Encounter (Signed)
Copied from CRM [phone]#[id number]. Topic: Quick Communication - See Telephone Encounter >> Jan 31, 2018  3:57 PM Oneal GroutSebastian, Jennifer S wrote: CRM for notification. See Telephone encounter for: 01/31/18. Patient would like us to contact pharmacy on diclofenac sodium (VOLTAREN) 1 % GEL, they will not fill or tell her why, told her to contact provider. Need PA?

## 2018-02-14 ENCOUNTER — Ambulatory Visit: Payer: BC Managed Care – PPO | Admitting: Gastroenterology

## 2018-03-15 LAB — BASIC METABOLIC PANEL: Glucose: 102

## 2018-03-15 LAB — HEMOGLOBIN A1C: Hemoglobin A1C: 5.4

## 2018-03-28 ENCOUNTER — Ambulatory Visit: Payer: BC Managed Care – PPO | Admitting: Gastroenterology

## 2018-04-05 ENCOUNTER — Ambulatory Visit: Payer: BC Managed Care – PPO | Admitting: Gastroenterology

## 2018-04-05 ENCOUNTER — Encounter: Payer: Self-pay | Admitting: Gastroenterology

## 2018-04-05 VITALS — BP 120/72 | HR 64 | Ht 64.0 in | Wt 137.0 lb

## 2018-04-05 DIAGNOSIS — Z8719 Personal history of other diseases of the digestive system: Secondary | ICD-10-CM

## 2018-04-05 DIAGNOSIS — K5730 Diverticulosis of large intestine without perforation or abscess without bleeding: Secondary | ICD-10-CM

## 2018-04-05 NOTE — Progress Notes (Signed)
Kristi Alvarado    [id number]    1950-10-05  Primary Care Physician:Crawford, Austin Miles, MD  Referring Physician: Myrlene Broker, MD [address] [place], [place] [address]  Chief complaint: History of diverticulitis HPI: 67 year old female with history of severe left-sided diverticulosis with evidence of acute diverticulitis during colonoscopy June 2019 here for follow-up visit.  She completed 5-day course of Cipro and Flagyl.  She has occasional left lower quadrant abdominal discomfort especially when she eats corn otherwise doing well with daily bowel movement.  Denies any blood in stool, nausea. She is training to run half marathon next month.   Outpatient Encounter Medications as of 04/05/2018  Medication Sig  . BIOTIN 5000 PO Take 1 tablet by mouth daily.  . Cholecalciferol (VITAMIN D PO) Take 1 tablet by mouth daily.  . fexofenadine (ALLEGRA) 180 MG tablet Take 1 tablet by mouth daily.  . fluticasone (FLONASE) 50 MCG/ACT nasal spray Place 1 spray into the nose daily.  Marland Kitchen glucosamine-chondroitin 500-400 MG tablet Take 1 tablet by mouth 3 (three) times daily.  Marland Kitchen Lifitegrast (XIIDRA) 5 % SOLN Place 1 drop into both eyes daily.  . Omega-3 Fatty Acids (OMEGA 3 PO) Take by mouth.  . [DISCONTINUED] diclofenac sodium (VOLTAREN) 1 % GEL Apply 2 g topically 4 (four) times daily.  . [DISCONTINUED] metroNIDAZOLE (FLAGYL) 500 MG tablet Take 1 tablet (500 mg total) by mouth 2 (two) times daily.  . [DISCONTINUED] Na Sulfate-K Sulfate-Mg Sulf 17.5-3.13-1.6 GM/177ML SOLN Suprep (no substitutions)-TAKE AS DIRECTED.  . [DISCONTINUED] predniSONE (DELTASONE) 20 MG tablet Take 2 tablets (40 mg total) by mouth daily with breakfast.   No facility-administered encounter medications on file as of 04/05/2018.     Allergies as of 04/05/2018  . (No Known Allergies)    Past Medical History:  Diagnosis Date  . Allergy     Past Surgical History:  Procedure Laterality Date    . BREAST BIOPSY Right    removal benign adenoma right breast  . BTL    . [facility]  [facility]rs [facility]"    Family History  Problem Relation Age of Onset  . Heart disease Mother   . Heart disease Father   . Cancer Maternal Grandmother        colon  . Colon cancer Maternal Grandmother   . Esophageal cancer Neg Hx   . Stomach cancer Neg Hx     Social History   Socioeconomic History  . Marital status: Married    Spouse name: Not on file  . Number of children: Not on file  . Years of education: Not on file  . Highest education level: Not on file  Occupational History  . Not on file  Social Needs  . Financial resource strain: Not on file  . Food insecurity:    Worry: Not on file    Inability: Not on file  . Transportation needs:    Medical: Not on file    Non-medical: Not on file  Tobacco Use  . Smoking status: Never Smoker  . Smokeless tobacco: Never Used  Substance and Sexual Activity  . Alcohol use: Yes    Alcohol/week: 0.0 standard drinks    Comment: social/maybe 2 times per month per pt  . Drug use: No  . Sexual activity: Not on file  Lifestyle  . Physical activity:    Days per week: Not on file    Minutes  per session: Not on file  . Stress: Not on file  Relationships  . Social connections:    Talks on phone: Not on file    Gets together: Not on file    Attends religious service: Not on file    Active member of club or organization: Not on file    Attends meetings of clubs or organizations: Not on file    Relationship status: Not on file  . Intimate partner violence:    Fear of current or ex partner: Not on file    Emotionally abused: Not on file    Physically abused: Not on file    Forced sexual activity: Not on file  Other Topics Concern  . Not on file  Social History Narrative  . Not on file      Review of systems: Review of Systems  Constitutional: Negative for fever and chills.  HENT: History of sinus trouble and post  nasal drip  Eyes: Negative for blurred vision.  Respiratory: Negative for cough, shortness of breath and wheezing.   Cardiovascular: Negative for chest pain and palpitations.  Gastrointestinal: as per HPI Genitourinary: Negative for dysuria, urgency, frequency and hematuria.  Musculoskeletal: Negative for myalgias, back pain and joint pain.  Skin: Negative for itching and rash.  Neurological: Negative for dizziness, tremors, focal weakness, seizures and loss of consciousness.  Endo/Heme/Allergies: Positive for seasonal allergies.  Psychiatric/Behavioral: Negative for depression, suicidal ideas and hallucinations.  All other systems reviewed and are negative.   Physical Exam: Vitals:   04/05/18 1501  BP: 120/72  Pulse: 64   Body mass index is 23.52 kg/m. Gen:      No acute distress HEENT:  EOMI, sclera anicteric Neck:     No masses; no thyromegaly Lungs:    Clear to auscultation bilaterally; normal respiratory effort CV:         Regular rate and rhythm; no murmurs Abd:      + bowel sounds; soft, non-tender; no palpable masses, no distension Ext:    No edema; adequate peripheral perfusion Skin:      Warm and dry; no rash Neuro: alert and oriented x 3 Psych: normal mood and affect  Data Reviewed:  Reviewed labs, radiology imaging, old records and pertinent past GI work up   Assessment and Plan/Recommendations:  67 year old female with history of severe colonic diverticulosis and sigmoid diverticulitis here for follow-up visit Continue high-fiber diet Increase water intake to 60 to 80 ounces daily Return as needed  15 minutes was spent face-to-face with the patient. Greater than 50% of the time used for counseling as well as treatment plan and follow-up. She had multiple questions which were answered to her satisfaction  K. Scherry Ran , MD [phone]    CC: Myrlene Broker, *

## 2018-04-05 NOTE — Patient Instructions (Signed)
Please follow up as needed 

## 2018-04-06 ENCOUNTER — Encounter: Payer: Self-pay | Admitting: Internal Medicine

## 2018-04-06 NOTE — Progress Notes (Signed)
Abstracted and sent to scan  

## 2018-04-10 ENCOUNTER — Ambulatory Visit: Payer: BC Managed Care – PPO

## 2018-04-27 ENCOUNTER — Ambulatory Visit (INDEPENDENT_AMBULATORY_CARE_PROVIDER_SITE_OTHER): Payer: BC Managed Care – PPO

## 2018-04-27 DIAGNOSIS — Z23 Encounter for immunization: Secondary | ICD-10-CM

## 2018-04-27 DIAGNOSIS — Z299 Encounter for prophylactic measures, unspecified: Secondary | ICD-10-CM

## 2019-01-31 ENCOUNTER — Encounter: Payer: Self-pay | Admitting: Internal Medicine

## 2019-02-13 ENCOUNTER — Other Ambulatory Visit: Payer: Self-pay | Admitting: Obstetrics and Gynecology

## 2019-02-13 DIAGNOSIS — R928 Other abnormal and inconclusive findings on diagnostic imaging of breast: Secondary | ICD-10-CM

## 2019-02-20 ENCOUNTER — Ambulatory Visit
Admission: RE | Admit: 2019-02-20 | Discharge: 2019-02-20 | Disposition: A | Discharge status: Home / Self Care | Payer: BC Managed Care – PPO | Source: Ambulatory Visit | Attending: Obstetrics and Gynecology | Admitting: Obstetrics and Gynecology

## 2019-02-20 ENCOUNTER — Ambulatory Visit
Admission: RE | Admit: 2019-02-20 | Discharge: 2019-02-20 | Disposition: A | Discharge status: Home / Self Care | Payer: Medicare Other | Source: Ambulatory Visit | Attending: Obstetrics and Gynecology | Admitting: Obstetrics and Gynecology

## 2019-02-20 ENCOUNTER — Other Ambulatory Visit: Payer: Self-pay

## 2019-02-20 DIAGNOSIS — R928 Other abnormal and inconclusive findings on diagnostic imaging of breast: Secondary | ICD-10-CM

## 2019-02-21 ENCOUNTER — Other Ambulatory Visit: Payer: Self-pay

## 2019-02-21 ENCOUNTER — Ambulatory Visit (INDEPENDENT_AMBULATORY_CARE_PROVIDER_SITE_OTHER): Payer: Medicare Other

## 2019-02-21 DIAGNOSIS — Z23 Encounter for immunization: Secondary | ICD-10-CM

## 2019-02-21 DIAGNOSIS — Z299 Encounter for prophylactic measures, unspecified: Secondary | ICD-10-CM

## 2019-05-23 ENCOUNTER — Other Ambulatory Visit: Payer: Medicare Other

## 2019-05-24 ENCOUNTER — Ambulatory Visit: Payer: Medicare Other | Attending: Internal Medicine

## 2019-05-24 DIAGNOSIS — Z20822 Contact with and (suspected) exposure to covid-19: Secondary | ICD-10-CM

## 2019-05-25 LAB — NOVEL CORONAVIRUS, NAA: SARS-CoV-2, NAA: NOT DETECTED

## 2019-06-28 DIAGNOSIS — D2272 Melanocytic nevi of left lower limb, including hip: Secondary | ICD-10-CM | POA: Diagnosis not present

## 2019-06-28 DIAGNOSIS — C4361 Malignant melanoma of right upper limb, including shoulder: Secondary | ICD-10-CM | POA: Diagnosis not present

## 2019-06-28 DIAGNOSIS — D225 Melanocytic nevi of trunk: Secondary | ICD-10-CM | POA: Diagnosis not present

## 2019-06-28 DIAGNOSIS — L408 Other psoriasis: Secondary | ICD-10-CM | POA: Diagnosis not present

## 2019-06-28 DIAGNOSIS — L570 Actinic keratosis: Secondary | ICD-10-CM | POA: Diagnosis not present

## 2019-06-28 DIAGNOSIS — Z23 Encounter for immunization: Secondary | ICD-10-CM | POA: Diagnosis not present

## 2019-06-28 DIAGNOSIS — L578 Other skin changes due to chronic exposure to nonionizing radiation: Secondary | ICD-10-CM | POA: Diagnosis not present

## 2019-06-28 DIAGNOSIS — D485 Neoplasm of uncertain behavior of skin: Secondary | ICD-10-CM | POA: Diagnosis not present

## 2019-06-28 DIAGNOSIS — Z85828 Personal history of other malignant neoplasm of skin: Secondary | ICD-10-CM | POA: Diagnosis not present

## 2019-06-28 DIAGNOSIS — Z808 Family history of malignant neoplasm of other organs or systems: Secondary | ICD-10-CM | POA: Diagnosis not present

## 2019-07-06 DIAGNOSIS — D0361 Melanoma in situ of right upper limb, including shoulder: Secondary | ICD-10-CM | POA: Diagnosis not present

## 2019-07-23 ENCOUNTER — Telehealth: Payer: Self-pay

## 2019-07-23 NOTE — Telephone Encounter (Signed)
New message    The patient going on a fishing trip in March asking for a motion sicking patch.

## 2019-07-25 MED ORDER — SCOPOLAMINE 1 MG/3DAYS TD PT72: 1.0000 | MEDICATED_PATCH | TRANSDERMAL | 0 refills | Status: DC

## 2019-07-25 NOTE — Telephone Encounter (Signed)
Notified pt w/MD response.../lmb 

## 2019-07-25 NOTE — Telephone Encounter (Signed)
Pt stated the trip consist of a day of deep see fishing so she only needs a patch for preporation of that day.

## 2019-07-25 NOTE — Telephone Encounter (Signed)
The patch last 3 days and you place behind the ear. The main side effect is dry mouth which generally is mild. It can be bad enough that people remove the patch. Make sure to wash hands well after applying or removing patch as the medicine can be harmful if it accidentally gets into the eye.

## 2019-07-25 NOTE — Addendum Note (Signed)
Addended by: Hillard Danker A on: 07/25/2019 11:37 AM   Modules accepted: Orders

## 2019-07-25 NOTE — Telephone Encounter (Signed)
We need to know how long trip is.

## 2019-07-27 ENCOUNTER — Ambulatory Visit: Payer: Medicare Other

## 2019-09-28 DIAGNOSIS — D485 Neoplasm of uncertain behavior of skin: Secondary | ICD-10-CM | POA: Diagnosis not present

## 2019-09-28 DIAGNOSIS — L578 Other skin changes due to chronic exposure to nonionizing radiation: Secondary | ICD-10-CM | POA: Diagnosis not present

## 2019-09-28 DIAGNOSIS — D225 Melanocytic nevi of trunk: Secondary | ICD-10-CM | POA: Diagnosis not present

## 2019-09-28 DIAGNOSIS — L430 Hypertrophic lichen planus: Secondary | ICD-10-CM | POA: Diagnosis not present

## 2019-09-28 DIAGNOSIS — L570 Actinic keratosis: Secondary | ICD-10-CM | POA: Diagnosis not present

## 2019-09-28 DIAGNOSIS — Z85820 Personal history of malignant melanoma of skin: Secondary | ICD-10-CM | POA: Diagnosis not present

## 2019-09-28 DIAGNOSIS — Z85828 Personal history of other malignant neoplasm of skin: Secondary | ICD-10-CM | POA: Diagnosis not present

## 2019-11-19 DIAGNOSIS — H16223 Keratoconjunctivitis sicca, not specified as Sjogren's, bilateral: Secondary | ICD-10-CM | POA: Diagnosis not present

## 2019-11-19 DIAGNOSIS — H18232 Secondary corneal edema, left eye: Secondary | ICD-10-CM | POA: Diagnosis not present

## 2019-11-19 DIAGNOSIS — H16142 Punctate keratitis, left eye: Secondary | ICD-10-CM | POA: Diagnosis not present

## 2019-12-03 DIAGNOSIS — H16223 Keratoconjunctivitis sicca, not specified as Sjogren's, bilateral: Secondary | ICD-10-CM | POA: Diagnosis not present

## 2019-12-03 DIAGNOSIS — H16143 Punctate keratitis, bilateral: Secondary | ICD-10-CM | POA: Diagnosis not present

## 2019-12-05 ENCOUNTER — Other Ambulatory Visit: Payer: Self-pay

## 2019-12-05 ENCOUNTER — Other Ambulatory Visit: Payer: Self-pay | Admitting: Obstetrics and Gynecology

## 2019-12-05 DIAGNOSIS — Z1231 Encounter for screening mammogram for malignant neoplasm of breast: Secondary | ICD-10-CM

## 2019-12-14 DIAGNOSIS — H16223 Keratoconjunctivitis sicca, not specified as Sjogren's, bilateral: Secondary | ICD-10-CM | POA: Diagnosis not present

## 2020-01-08 DIAGNOSIS — L578 Other skin changes due to chronic exposure to nonionizing radiation: Secondary | ICD-10-CM | POA: Diagnosis not present

## 2020-01-08 DIAGNOSIS — Z85828 Personal history of other malignant neoplasm of skin: Secondary | ICD-10-CM | POA: Diagnosis not present

## 2020-01-08 DIAGNOSIS — Z85820 Personal history of malignant melanoma of skin: Secondary | ICD-10-CM | POA: Diagnosis not present

## 2020-01-08 DIAGNOSIS — D225 Melanocytic nevi of trunk: Secondary | ICD-10-CM | POA: Diagnosis not present

## 2020-01-08 DIAGNOSIS — L570 Actinic keratosis: Secondary | ICD-10-CM | POA: Diagnosis not present

## 2020-02-08 DIAGNOSIS — H16223 Keratoconjunctivitis sicca, not specified as Sjogren's, bilateral: Secondary | ICD-10-CM | POA: Diagnosis not present

## 2020-02-12 ENCOUNTER — Other Ambulatory Visit: Payer: Self-pay

## 2020-02-12 ENCOUNTER — Ambulatory Visit
Admission: RE | Admit: 2020-02-12 | Discharge: 2020-02-12 | Disposition: A | Discharge status: Home / Self Care | Payer: Medicare PPO | Source: Ambulatory Visit | Attending: Obstetrics and Gynecology | Admitting: Obstetrics and Gynecology

## 2020-02-12 DIAGNOSIS — Z1231 Encounter for screening mammogram for malignant neoplasm of breast: Secondary | ICD-10-CM | POA: Diagnosis not present

## 2020-02-18 DIAGNOSIS — M8588 Other specified disorders of bone density and structure, other site: Secondary | ICD-10-CM | POA: Diagnosis not present

## 2020-02-18 DIAGNOSIS — Z6824 Body mass index (BMI) 24.0-24.9, adult: Secondary | ICD-10-CM | POA: Diagnosis not present

## 2020-02-18 DIAGNOSIS — N3945 Continuous leakage: Secondary | ICD-10-CM | POA: Diagnosis not present

## 2020-02-18 DIAGNOSIS — N958 Other specified menopausal and perimenopausal disorders: Secondary | ICD-10-CM | POA: Diagnosis not present

## 2020-02-18 DIAGNOSIS — Z01419 Encounter for gynecological examination (general) (routine) without abnormal findings: Secondary | ICD-10-CM | POA: Diagnosis not present

## 2020-02-18 DIAGNOSIS — Z780 Asymptomatic menopausal state: Secondary | ICD-10-CM | POA: Diagnosis not present

## 2020-02-19 DIAGNOSIS — M8580 Other specified disorders of bone density and structure, unspecified site: Secondary | ICD-10-CM | POA: Diagnosis not present

## 2020-02-19 DIAGNOSIS — Z13220 Encounter for screening for lipoid disorders: Secondary | ICD-10-CM | POA: Diagnosis not present

## 2020-02-19 DIAGNOSIS — Z13228 Encounter for screening for other metabolic disorders: Secondary | ICD-10-CM | POA: Diagnosis not present

## 2020-02-19 DIAGNOSIS — M818 Other osteoporosis without current pathological fracture: Secondary | ICD-10-CM | POA: Diagnosis not present

## 2020-02-19 DIAGNOSIS — Z1329 Encounter for screening for other suspected endocrine disorder: Secondary | ICD-10-CM | POA: Diagnosis not present

## 2020-02-19 LAB — TSH: TSH: 0.98 (ref 0.41–5.90)

## 2020-02-19 LAB — LIPID PANEL
Cholesterol: 224 — AB (ref 0–200)
HDL: 76 — AB (ref 35–70)
LDL Cholesterol: 139
Triglycerides: 54 (ref 40–160)

## 2020-02-19 LAB — BASIC METABOLIC PANEL
BUN: 13 (ref 4–21)
Creatinine: 0.7 (ref 0.5–1.1)
Glucose: 91
Potassium: 4.5 (ref 3.4–5.3)
Sodium: 141 (ref 137–147)

## 2020-02-19 LAB — VITAMIN D 25 HYDROXY (VIT D DEFICIENCY, FRACTURES): Vit D, 25-Hydroxy: 74.7

## 2020-02-19 LAB — HEPATIC FUNCTION PANEL
ALT: 16 (ref 7–35)
AST: 24 (ref 13–35)
Alkaline Phosphatase: 65 (ref 25–125)

## 2020-02-19 LAB — COMPREHENSIVE METABOLIC PANEL: GFR calc non Af Amer: 91

## 2020-02-29 DIAGNOSIS — H16143 Punctate keratitis, bilateral: Secondary | ICD-10-CM | POA: Diagnosis not present

## 2020-03-17 DIAGNOSIS — H16142 Punctate keratitis, left eye: Secondary | ICD-10-CM | POA: Diagnosis not present

## 2020-03-25 ENCOUNTER — Ambulatory Visit (INDEPENDENT_AMBULATORY_CARE_PROVIDER_SITE_OTHER): Payer: Medicare PPO

## 2020-03-25 ENCOUNTER — Other Ambulatory Visit: Payer: Self-pay

## 2020-03-25 DIAGNOSIS — D225 Melanocytic nevi of trunk: Secondary | ICD-10-CM | POA: Diagnosis not present

## 2020-03-25 DIAGNOSIS — Z23 Encounter for immunization: Secondary | ICD-10-CM

## 2020-03-25 DIAGNOSIS — L570 Actinic keratosis: Secondary | ICD-10-CM | POA: Diagnosis not present

## 2020-03-25 DIAGNOSIS — Z85828 Personal history of other malignant neoplasm of skin: Secondary | ICD-10-CM | POA: Diagnosis not present

## 2020-03-25 DIAGNOSIS — L219 Seborrheic dermatitis, unspecified: Secondary | ICD-10-CM | POA: Diagnosis not present

## 2020-03-25 DIAGNOSIS — L578 Other skin changes due to chronic exposure to nonionizing radiation: Secondary | ICD-10-CM | POA: Diagnosis not present

## 2020-03-25 DIAGNOSIS — Z85820 Personal history of malignant melanoma of skin: Secondary | ICD-10-CM | POA: Diagnosis not present

## 2020-03-25 NOTE — Progress Notes (Signed)
High Dose flu vaccine given w/o any complications. 

## 2020-04-07 DIAGNOSIS — H16223 Keratoconjunctivitis sicca, not specified as Sjogren's, bilateral: Secondary | ICD-10-CM | POA: Diagnosis not present

## 2020-04-07 DIAGNOSIS — H16142 Punctate keratitis, left eye: Secondary | ICD-10-CM | POA: Diagnosis not present

## 2020-04-11 DIAGNOSIS — H16223 Keratoconjunctivitis sicca, not specified as Sjogren's, bilateral: Secondary | ICD-10-CM | POA: Diagnosis not present

## 2020-04-11 DIAGNOSIS — H16142 Punctate keratitis, left eye: Secondary | ICD-10-CM | POA: Diagnosis not present

## 2020-05-27 ENCOUNTER — Other Ambulatory Visit: Payer: Self-pay

## 2020-05-27 ENCOUNTER — Encounter: Payer: Self-pay | Admitting: Internal Medicine

## 2020-05-27 ENCOUNTER — Ambulatory Visit (INDEPENDENT_AMBULATORY_CARE_PROVIDER_SITE_OTHER): Payer: Medicare PPO | Admitting: Internal Medicine

## 2020-05-27 VITALS — BP 130/80 | HR 67 | Temp 98.2°F | Ht 64.0 in | Wt 135.0 lb

## 2020-05-27 DIAGNOSIS — Z0000 Encounter for general adult medical examination without abnormal findings: Secondary | ICD-10-CM | POA: Diagnosis not present

## 2020-05-27 DIAGNOSIS — Z111 Encounter for screening for respiratory tuberculosis: Secondary | ICD-10-CM

## 2020-05-27 NOTE — Patient Instructions (Addendum)
Iliotibial Band Syndrome Rehab Ask your health care provider which exercises are safe for you. Do exercises exactly as told by your health care provider and adjust them as directed. It is normal to feel mild stretching, pulling, tightness, or discomfort as you do these exercises. Stop right away if you feel sudden pain or your pain gets significantly worse. Do not begin these exercises until told by your health care provider. Stretching and range-of-motion exercises These exercises warm up your muscles and joints and improve the movement and flexibility of your hip and pelvis. Quadriceps stretch, prone  1. Lie on your abdomen on a firm surface, such as a bed or padded floor (prone position). 2. Bend your left / right knee and reach back to hold your ankle or pant leg. If you cannot reach your ankle or pant leg, loop a belt around your foot and grab the belt instead. 3. Gently pull your heel toward your buttocks. Your knee should not slide out to the side. You should feel a stretch in the front of your thigh and knee (quadriceps). 4. Hold this position for __________ seconds. Repeat __________ times. Complete this exercise __________ times a day. Iliotibial band stretch An iliotibial band is a strong band of muscle tissue that runs from the outer side of your hip to the outer side of your thigh and knee. 1. Lie on your side with your left / right leg in the top position. 2. Bend both of your knees and grab your left / right ankle. Stretch out your bottom arm to help you balance. 3. Slowly bring your top knee back so your thigh goes behind your trunk. 4. Slowly lower your top leg toward the floor until you feel a gentle stretch on the outside of your left / right hip and thigh. If you do not feel a stretch and your knee will not fall farther, place the heel of your other foot on top of your knee and pull your knee down toward the floor with your foot. 5. Hold this position for __________  seconds. Repeat __________ times. Complete this exercise __________ times a day. Strengthening exercises These exercises build strength and endurance in your hip and pelvis. Endurance is the ability to use your muscles for a long time, even after they get tired. Straight leg raises, side-lying This exercise strengthens the muscles that rotate the leg at the hip and move it away from your body (hip abductors). 1. Lie on your side with your left / right leg in the top position. Lie so your head, shoulder, hip, and knee line up. You may bend your bottom knee to help you balance. 2. Roll your hips slightly forward so your hips are stacked directly over each other and your left / right knee is facing forward. 3. Tense the muscles in your outer thigh and lift your top leg 4-6 inches (10-15 cm). 4. Hold this position for __________ seconds. 5. Slowly return to the starting position. Let your muscles relax completely before doing another repetition. Repeat __________ times. Complete this exercise __________ times a day. Leg raises, prone This exercise strengthens the muscles that move the hips (hip extensors). 1. Lie on your abdomen on your bed or a firm surface. You can put a pillow under your hips if that is more comfortable for your lower back. 2. Bend your left / right knee so your foot is straight up in the air. 3. Squeeze your buttocks muscles and lift your left / right  thigh off the bed. Do not let your back arch. 4. Tense your thigh muscle as hard as you can without increasing any knee pain. 5. Hold this position for __________ seconds. 6. Slowly lower your leg to the starting position and allow it to relax completely. Repeat __________ times. Complete this exercise __________ times a day. Hip hike 1. Stand sideways on a bottom step. Stand on your left / right leg with your other foot unsupported next to the step. You can hold on to the railing or wall for balance if needed. 2. Keep your knees  straight and your torso square. Then lift your left / right hip up toward the ceiling. 3. Slowly let your left / right hip lower toward the floor, past the starting position. Your foot should get closer to the floor. Do not lean or bend your knees. Repeat __________ times. Complete this exercise __________ times a day. This information is not intended to replace advice given to you by your health care provider. Make sure you discuss any questions you have with your health care provider. Document Revised: 09/14/2018 Document Reviewed: 03/15/2018 Elsevier Patient Education  2020 [employer].   Health Maintenance, Female Adopting a healthy lifestyle and getting preventive care are important in promoting health and wellness. Ask your health care provider about:  The right schedule for you to have regular tests and exams.  Things you can do on your own to prevent diseases and keep yourself healthy. What should I know about diet, weight, and exercise? Eat a healthy diet   Eat a diet that includes plenty of vegetables, fruits, low-fat dairy products, and lean protein.  Do not eat a lot of foods that are high in solid fats, added sugars, or sodium. Maintain a healthy weight Body mass index (BMI) is used to identify weight problems. It estimates body fat based on height and weight. Your health care provider can help determine your BMI and help you achieve or maintain a healthy weight. Get regular exercise Get regular exercise. This is one of the most important things you can do for your health. Most adults should:  Exercise for at least 150 minutes each week. The exercise should increase your heart rate and make you sweat (moderate-intensity exercise).  Do strengthening exercises at least twice a week. This is in addition to the moderate-intensity exercise.  Spend less time sitting. Even light physical activity can be beneficial. Watch cholesterol and blood lipids Have your blood tested for  lipids and cholesterol at 68 years of age, then have this test every 5 years. Have your cholesterol levels checked more often if:  Your lipid or cholesterol levels are high.  You are older than 68 years of age.  You are at high risk for heart disease. What should I know about cancer screening? Depending on your health history and family history, you may need to have cancer screening at various ages. This may include screening for:  Breast cancer.  Cervical cancer.  Colorectal cancer.  Skin cancer.  Lung cancer. What should I know about heart disease, diabetes, and high blood pressure? Blood pressure and heart disease  High blood pressure causes heart disease and increases the risk of stroke. This is more likely to develop in people who have high blood pressure readings, are of African descent, or are overweight.  Have your blood pressure checked: ? Every 3-5 years if you are 3-34 years of age. ? Every year if you are 48 years old or older. Diabetes  Have regular diabetes screenings. This checks your fasting blood sugar level. Have the screening done:  Once every three years after age 68 if you are at a normal weight and have a low risk for diabetes.  More often and at a younger age if you are overweight or have a high risk for diabetes. What should I know about preventing infection? Hepatitis B If you have a higher risk for hepatitis B, you should be screened for this virus. Talk with your health care provider to find out if you are at risk for hepatitis B infection. Hepatitis C Testing is recommended for:  Everyone born from [id number] through 1965.  Anyone with known risk factors for hepatitis C. Sexually transmitted infections (STIs)  Get screened for STIs, including gonorrhea and chlamydia, if: ? You are sexually active and are younger than 68 years of age. ? You are older than 68 years of age and your health care provider tells you that you are at risk for this type of  infection. ? Your sexual activity has changed since you were last screened, and you are at increased risk for chlamydia or gonorrhea. Ask your health care provider if you are at risk.  Ask your health care provider about whether you are at high risk for HIV. Your health care provider may recommend a prescription medicine to help prevent HIV infection. If you choose to take medicine to prevent HIV, you should first get tested for HIV. You should then be tested every 3 months for as long as you are taking the medicine. Pregnancy  If you are about to stop having your period (premenopausal) and you may become pregnant, seek counseling before you get pregnant.  Take 400 to 800 micrograms (mcg) of folic acid every day if you become pregnant.  Ask for birth control (contraception) if you want to prevent pregnancy. Osteoporosis and menopause Osteoporosis is a disease in which the bones lose minerals and strength with aging. This can result in bone fractures. If you are 68 years old or older, or if you are at risk for osteoporosis and fractures, ask your health care provider if you should:  Be screened for bone loss.  Take a calcium or vitamin D supplement to lower your risk of fractures.  Be given hormone replacement therapy (HRT) to treat symptoms of menopause. Follow these instructions at home: Lifestyle  Do not use any products that contain nicotine or tobacco, such as cigarettes, e-cigarettes, and chewing tobacco. If you need help quitting, ask your health care provider.  Do not use street drugs.  Do not share needles.  Ask your health care provider for help if you need support or information about quitting drugs. Alcohol use  Do not drink alcohol if: ? Your health care provider tells you not to drink. ? You are pregnant, may be pregnant, or are planning to become pregnant.  If you drink alcohol: ? Limit how much you use to 0-1 drink a day. ? Limit intake if you are  breastfeeding.  Be aware of how much alcohol is in your drink. In the [place] one drink equals one 12 oz bottle of beer (355 mL), one 5 oz glass of wine (148 mL), or one 1 oz glass of hard liquor (44 mL). General instructions  Schedule regular health, dental, and eye exams.  Stay current with your vaccines.  Tell your health care provider if: ? You often feel depressed. ? You have ever been abused or do not feel safe at  home. Summary  Adopting a healthy lifestyle and getting preventive care are important in promoting health and wellness.  Follow your health care provider's instructions about healthy diet, exercising, and getting tested or screened for diseases.  Follow your health care provider's instructions on monitoring your cholesterol and blood pressure. This information is not intended to replace advice given to you by your health care provider. Make sure you discuss any questions you have with your health care provider. Document Revised: 05/17/2018 Document Reviewed: 05/17/2018 Elsevier Patient Education  2020 [employer].

## 2020-05-27 NOTE — Progress Notes (Signed)
Subjective:   Patient ID: Kristi Alvarado, female    DOB: 04-05-1951, 69 y.o.   MRN: [id number]  HPI Here for medicare wellness and physical, no new complaints. Please see A/P for status and treatment of chronic medical problems.   Diet: heart healthy Physical activity: runs 3 miles 3-4 times per week Depression/mood screen: negative Hearing: intact to whispered voice Visual acuity: grossly normal, performs annual eye exam  ADLs: capable Fall risk: none Home safety: good Cognitive evaluation: intact to orientation, naming, recall and repetition EOL planning: adv directives discussed  Flowsheet Row Office Visit from 05/27/2020 in [facility] Total Score 0      I have personally reviewed and have noted 1. The patient's medical and social history - reviewed today no changes 2. Their use of alcohol, tobacco or illicit drugs 3. Their current medications and supplements 4. The patient's functional ability including ADL's, fall risks, home safety risks and hearing or visual impairment. 5. Diet and physical activities 6. Evidence for depression or mood disorders 7. Care team reviewed and updated  Patient Care Team: Myrlene Broker, MD as PCP - General (Internal Medicine) Past Medical History:  Diagnosis Date  . Allergy    Past Surgical History:  Procedure Laterality Date  . BREAST BIOPSY Right    removal benign adenoma right breast  . BTL    . [facility]  [facility]rs [facility]   Family History  Problem Relation Age of Onset  . Heart disease Mother   . Heart disease Father   . Cancer Maternal Grandmother        colon  . Colon cancer Maternal Grandmother   . Esophageal cancer Neg Hx   . Stomach cancer Neg Hx    Review of Systems  Constitutional: Negative.   HENT: Negative.   Eyes: Negative.   Respiratory: Negative for cough, chest tightness and shortness of breath.   Cardiovascular: Negative for chest pain,  palpitations and leg swelling.  Gastrointestinal: Negative for abdominal distention, abdominal pain, constipation, diarrhea, nausea and vomiting.  Musculoskeletal: Negative.   Skin: Negative.   Neurological: Negative.   Psychiatric/Behavioral: Negative.     Objective:  Physical Exam Constitutional:      Appearance: She is well-developed and well-nourished.  HENT:     Head: Normocephalic and atraumatic.  Eyes:     Extraocular Movements: EOM normal.  Cardiovascular:     Rate and Rhythm: Normal rate and regular rhythm.  Pulmonary:     Effort: Pulmonary effort is normal. No respiratory distress.     Breath sounds: Normal breath sounds. No wheezing or rales.  Abdominal:     General: Bowel sounds are normal. There is no distension.     Palpations: Abdomen is soft.     Tenderness: There is no abdominal tenderness. There is no rebound.  Musculoskeletal:        General: No edema.     Cervical back: Normal range of motion.  Skin:    General: Skin is warm and dry.  Neurological:     Mental Status: She is alert and oriented to person, place, and time.     Coordination: Coordination normal.  Psychiatric:        Mood and Affect: Mood and affect normal.     Vitals:   05/27/20 0918  BP: 130/80  Pulse: 67  Temp: 98.2 F (36.8 C)  TempSrc: Oral  SpO2: 98%  Weight: 135 lb (61.2  kg)  Height: 5\' 4"  (1.626 m)   EKG: Rate 62, axis normal, interval normal, sinus, no st or t wave changes, no prior to compare  This visit occurred during the SARS-CoV-2 public health emergency.  Safety protocols were in place, including screening questions prior to the visit, additional usage of staff PPE, and extensive cleaning of exam room while observing appropriate contact time as indicated for disinfecting solutions.   Assessment & Plan:  TB skin test placed

## 2020-05-27 NOTE — Assessment & Plan Note (Signed)
Flu shot up to date. Covid-19 up to date including booster. Pneumonia complete. Shingrix complete. Tetanus due 2027. Colonoscopy due 2029. Mammogram due 2023, pap smear aged out and dexa has had recently with gyn. Counseled about sun safety and mole surveillance. Counseled about the dangers of distracted driving. Given 10 year screening recommendations.

## 2020-05-29 ENCOUNTER — Encounter: Payer: Self-pay | Admitting: Internal Medicine

## 2020-05-29 LAB — TB SKIN TEST
Induration: 0 mm
TB Skin Test: NEGATIVE

## 2020-06-27 DIAGNOSIS — L821 Other seborrheic keratosis: Secondary | ICD-10-CM | POA: Diagnosis not present

## 2020-06-27 DIAGNOSIS — C44519 Basal cell carcinoma of skin of other part of trunk: Secondary | ICD-10-CM | POA: Diagnosis not present

## 2020-06-27 DIAGNOSIS — Z85820 Personal history of malignant melanoma of skin: Secondary | ICD-10-CM | POA: Diagnosis not present

## 2020-06-27 DIAGNOSIS — L814 Other melanin hyperpigmentation: Secondary | ICD-10-CM | POA: Diagnosis not present

## 2020-06-27 DIAGNOSIS — D225 Melanocytic nevi of trunk: Secondary | ICD-10-CM | POA: Diagnosis not present

## 2020-06-27 DIAGNOSIS — Z808 Family history of malignant neoplasm of other organs or systems: Secondary | ICD-10-CM | POA: Diagnosis not present

## 2020-06-27 DIAGNOSIS — D2272 Melanocytic nevi of left lower limb, including hip: Secondary | ICD-10-CM | POA: Diagnosis not present

## 2020-06-27 DIAGNOSIS — L578 Other skin changes due to chronic exposure to nonionizing radiation: Secondary | ICD-10-CM | POA: Diagnosis not present

## 2020-06-27 DIAGNOSIS — D2261 Melanocytic nevi of right upper limb, including shoulder: Secondary | ICD-10-CM | POA: Diagnosis not present

## 2020-06-27 DIAGNOSIS — Z85828 Personal history of other malignant neoplasm of skin: Secondary | ICD-10-CM | POA: Diagnosis not present

## 2020-08-12 DIAGNOSIS — C44511 Basal cell carcinoma of skin of breast: Secondary | ICD-10-CM | POA: Diagnosis not present

## 2020-08-12 DIAGNOSIS — L905 Scar conditions and fibrosis of skin: Secondary | ICD-10-CM | POA: Diagnosis not present

## 2020-08-20 DIAGNOSIS — R7989 Other specified abnormal findings of blood chemistry: Secondary | ICD-10-CM | POA: Diagnosis not present

## 2020-09-08 ENCOUNTER — Other Ambulatory Visit: Payer: Self-pay | Admitting: Internal Medicine

## 2020-09-08 MED ORDER — SCOPOLAMINE 1 MG/3DAYS TD PT72: 1.0000 | MEDICATED_PATCH | TRANSDERMAL | 0 refills | Status: DC

## 2020-10-20 DIAGNOSIS — H16143 Punctate keratitis, bilateral: Secondary | ICD-10-CM | POA: Diagnosis not present

## 2020-10-20 DIAGNOSIS — H16223 Keratoconjunctivitis sicca, not specified as Sjogren's, bilateral: Secondary | ICD-10-CM | POA: Diagnosis not present

## 2020-10-23 ENCOUNTER — Other Ambulatory Visit: Payer: Self-pay

## 2020-10-24 ENCOUNTER — Ambulatory Visit: Payer: Medicare PPO | Admitting: Internal Medicine

## 2020-10-24 ENCOUNTER — Other Ambulatory Visit: Payer: Self-pay

## 2020-10-24 ENCOUNTER — Encounter: Payer: Self-pay | Admitting: Internal Medicine

## 2020-10-24 ENCOUNTER — Ambulatory Visit (INDEPENDENT_AMBULATORY_CARE_PROVIDER_SITE_OTHER): Payer: Medicare PPO

## 2020-10-24 VITALS — BP 122/82 | HR 66 | Temp 98.3°F | Resp 18 | Ht 64.0 in | Wt 130.8 lb

## 2020-10-24 DIAGNOSIS — M25562 Pain in left knee: Secondary | ICD-10-CM | POA: Diagnosis not present

## 2020-10-24 DIAGNOSIS — M1711 Unilateral primary osteoarthritis, right knee: Secondary | ICD-10-CM | POA: Diagnosis not present

## 2020-10-24 DIAGNOSIS — S8992XA Unspecified injury of left lower leg, initial encounter: Secondary | ICD-10-CM | POA: Diagnosis not present

## 2020-10-24 NOTE — Progress Notes (Signed)
   Subjective:   Patient ID: Kristi Alvarado, female    DOB: 10/04/1950, 70 y.o.   MRN: [id number]  HPI The patient is a 70 YO female coming in for new left knee pain. Started after running. No known injury and she is a runner. Denies fall. When she was doing the walking cool down she got severe pain in the knee and swelling. Iced the knee and using aleve. Some improvement and using sleeve. She is still having swelling and pain. Would like to get back to activity. Did try walking yesterday which did not aggravate it.   Review of Systems  Constitutional: Negative.   HENT: Negative.   Eyes: Negative.   Respiratory: Negative for cough, chest tightness and shortness of breath.   Cardiovascular: Negative for chest pain, palpitations and leg swelling.  Gastrointestinal: Negative for abdominal distention, abdominal pain, constipation, diarrhea, nausea and vomiting.  Musculoskeletal: Positive for arthralgias and joint swelling.  Skin: Negative.   Neurological: Negative.   Psychiatric/Behavioral: Negative.     Objective:  Physical Exam Constitutional:      Appearance: She is well-developed.  HENT:     Head: Normocephalic and atraumatic.  Cardiovascular:     Rate and Rhythm: Normal rate and regular rhythm.  Pulmonary:     Effort: Pulmonary effort is normal. No respiratory distress.     Breath sounds: Normal breath sounds. No wheezing or rales.  Abdominal:     General: Bowel sounds are normal. There is no distension.     Palpations: Abdomen is soft.     Tenderness: There is no abdominal tenderness. There is no rebound.  Musculoskeletal:        General: Tenderness present.     Cervical back: Normal range of motion.     Comments: Left knee with some soft tissue swelling, minimal knee effusion, some tenderness along LCL, ACL and PCL intact  Skin:    General: Skin is warm and dry.  Neurological:     Mental Status: She is alert and oriented to person, place, and time.     Coordination:  Coordination normal.     Vitals:   10/24/20 1019  BP: 122/82  Pulse: 66  Resp: 18  Temp: 98.3 F (36.8 C)  TempSrc: Oral  SpO2: 96%  Weight: 130 lb 12.8 oz (59.3 kg)  Height: 5\' 4"  (1.626 m)    This visit occurred during the SARS-CoV-2 public health emergency.  Safety protocols were in place, including screening questions prior to the visit, additional usage of staff PPE, and extensive cleaning of exam room while observing appropriate contact time as indicated for disinfecting solutions.   Assessment & Plan:

## 2020-10-24 NOTE — Assessment & Plan Note (Signed)
X-ray ordered to rule out stress fracture tibia/fibula. Referral to sports medicine to facilitate efficient rehab and get her back to running as quickly as possible. Continue RICE.

## 2020-10-24 NOTE — Patient Instructions (Addendum)
We will get an x-ray today.

## 2020-10-27 ENCOUNTER — Other Ambulatory Visit: Payer: Self-pay

## 2020-10-27 ENCOUNTER — Ambulatory Visit: Payer: Self-pay

## 2020-10-27 ENCOUNTER — Ambulatory Visit: Payer: Medicare PPO | Admitting: Family Medicine

## 2020-10-27 ENCOUNTER — Encounter: Payer: Self-pay | Admitting: Family Medicine

## 2020-10-27 VITALS — BP 112/72 | HR 67 | Ht 64.0 in | Wt 140.2 lb

## 2020-10-27 DIAGNOSIS — M25562 Pain in left knee: Secondary | ICD-10-CM | POA: Diagnosis not present

## 2020-10-27 NOTE — Progress Notes (Signed)
I, Christoper Fabian, LAT, ATC, am serving as scribe for Dr. Clementeen Graham.  Subjective:    I'm seeing this patient as a consultation for: Dr. Hillard Danker. Note will be routed back to referring provider/PCP.  CC: Left knee pain  HPI: Pt is a 71 y/o female c/o L knee pain x approximately 2 weeks. MOI: Pt experience knee pain after a run, while doing her walking cool-down. Pt locates her pain to her L lateral and anterior knee.  Pt is an avid runner and typically runs 3x/week x 3 miles each.  She participates in 5Ks and half marathons.  L knee swelling: yes Mechanical symptoms: Mild clicking and popping Aggravates: transitioning from sit-to-stand; walking; prolonged standing Treatments tried: ice, naproxen, knee brace; TENs  Dx imaging: 10/24/20 L knee XR  Past medical history, Surgical history, Family history, Social history, Allergies, and medications have been entered into the medical record, reviewed.   Review of Systems: No new headache, visual changes, nausea, vomiting, diarrhea, constipation, dizziness, abdominal pain, skin rash, fevers, chills, night sweats, weight loss, swollen lymph nodes, body aches, joint swelling, muscle aches, chest pain, shortness of breath, mood changes, visual or auditory hallucinations.   Objective:    Vitals:   10/27/20 1412  BP: 112/72  Pulse: 67  SpO2: 98%   General: Well Developed, well nourished, and in no acute distress.  Neuro/Psych: Alert and oriented x3, extra-ocular muscles intact, able to move all 4 extremities, sensation grossly intact. Skin: Warm and dry, no rashes noted.  Respiratory: Not using accessory muscles, speaking in full sentences, trachea midline.  Cardiovascular: Pulses palpable, no extremity edema. Abdomen: Does not appear distended. MSK: Left leg mild effusion.  Mild genu valgus appearance Normal motion with crepitation. Tender palpation lateral joint line. Slight laxity with LCL stress test.  No pain to LCL stress  test. No laxity to anterior drawer testing or posterior drawer testing. Positive lateral McMurray's test. Intact strength.  Lab and Radiology Results No results found for this or any previous visit (from the past 72 hour(s)). DG Knee Complete 4 Views Left  Result Date: 10/25/2020 CLINICAL DATA:  Knee injury. Recent fall with generalized pain since. EXAM: LEFT KNEE - COMPLETE 4+ VIEW COMPARISON:  None. FINDINGS: No fracture or dislocation. Joint spaces are preserved. Mild tricompartmental peripheral spurring. Small knee joint effusion. There may be a small ossified intra-articular body in the posteromedial joint. IMPRESSION: 1. No fracture or dislocation of the left knee. 2. 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 Possible small ossified intra-articular body. Electronically Signed   By: Narda Rutherford M.D.   On: 10/25/2020 23:51  I, Clementeen Graham, personally (independently) visualized and performed the interpretation of the images attached in this note.  Procedure: Real-time Ultrasound Guided Injection of left knee superior lateral patellar space Device: Philips Affiniti 50G Images permanently stored and available for review in PACS Ultrasound evaluation prior to injection reveals a significantly degenerative lateral meniscus that is extruded with tear and parameniscal cyst.  Intact LCL.  Small Baker's cyst present. Verbal informed consent obtained.  Discussed risks and benefits of procedure. Warned about infection bleeding damage to structures skin hypopigmentation and fat atrophy among others. Patient expresses understanding and agreement Time-out conducted.   Noted no overlying erythema, induration, or other signs of local infection.   Skin prepped in a sterile fashion.   Local anesthesia: Topical Ethyl chloride.   With sterile technique and under real time ultrasound guidance:  40 mg of Kenalog and 2 mL of  Marcaine injected into knee joint. Fluid seen entering the  joint capsule.   Completed without difficulty   Pain immediately resolved suggesting accurate placement of the medication.   Advised to call if fevers/chills, erythema, induration, drainage, or persistent bleeding.   Images permanently stored and available for review in the ultrasound unit.  Impression: Technically successful ultrasound guided injection.        Impression and Recommendations:    Assessment and Plan: 70 y.o. female with knee pain.  Pain is predominantly lateral.  Patient has genu valgus with significant degenerative appearance of lateral meniscus with extrusion parameniscal cyst and a lateral meniscus tear.  I think this is the main source of her pain.  The mild instability to LCL stress testing I think is predominantly due to the lateral compartment degeneration and not due to an LCL tear.  Plan for compression knee sleeve Voltaren gel and steroid injection.  If not improved she may be a good candidate for a lateral off loader brace.  Ultimately she may require MRI for potential surgical planning.Marland Kitchen  PDMP not reviewed this encounter. Orders Placed This Encounter  Procedures  . [place] LIMITED JOINT SPACE STRUCTURES LOW LEFT(NO LINKED CHARGES)    Order Specific Question:   Reason for Exam (SYMPTOM  OR DIAGNOSIS REQUIRED)    Answer:   L knee pain    Order Specific Question:   Preferred imaging location?    Answer:   [facility]   No orders of the defined types were placed in this encounter.   Discussed warning signs or symptoms. Please see discharge instructions. Patient expresses understanding.   The above documentation has been reviewed and is accurate and complete Clementeen Graham, M.D.

## 2020-10-27 NOTE — Patient Instructions (Addendum)
Nice to meet you today.  You had a L knee injection today.  Call or go to the ER if you develop a large red swollen joint with extreme pain or oozing puss.   I recommend you obtained a compression sleeve to help with your joint problems. There are many options on the market however I recommend obtaining a knee Body Helix compression sleeve.  You can find information (including how to appropriate measure yourself for sizing) can be found at www.[email or web address].ch.  Many of these products are health savings account (HSA) eligible.   You can use the compression sleeve at any time throughout the day but is most important to use while being active as well as for 2 hours post-activity.   It is appropriate to ice following activity with the compression sleeve in place.  Please use Voltaren gel (Generic Diclofenac Gel) up to 4x daily for pain as needed.  This is available over-the-counter as both the name brand Voltaren gel and the generic diclofenac gel.  Please follow-up in 6 weeks.

## 2020-12-10 NOTE — Progress Notes (Signed)
I, Philbert Riser, LAT, ATC acting as a scribe for Clementeen Graham, MD.  Kristi Alvarado is a 70 y.o. female who presents to [facility] today for f/u L knee pain. MOI: Pt experience knee pain after a run, while doing her walking cool-down. Pt is an avid runner and typically runs 3x/week x 3 miles each and participates in 5Ks and half marathons. Pt was last seen by Dr. Denyse Amass on 10/27/20 and was given a L knee steroid injection and advised to use Voltaren gel and a compressive knee sleeve. Today, pt reports the anterior aspect around the patella is feeling better. Pt c/o pain along the lateral aspect of knee. Pt has been compliant w/ wearing knee compression sleeve and notes it does help. Pt has been very active, babysitting her 2 grandkids and has been walking. Pt has not yet returned to running.  Dx imaging: 10/24/20 L knee XR  Pertinent review of systems: No fevers or chills  Relevant historical information: Seasonal allergies   Exam:  BP 132/82 (BP Location: Right Arm, Patient Position: Sitting, Cuff Size: Normal)   Pulse 87   Ht 5\' 4"  (1.626 m)   Wt 136 lb (61.7 kg)   SpO2 97%   BMI 23.34 kg/m  General: Well Developed, well nourished, and in no acute distress.   MSK: Left knee tender palpation lateral joint line. Slight laxity to LCL stress test.   Lab and Radiology Results DG Knee Complete 4 Views Left  Result Date: 10/25/2020 CLINICAL DATA:  Knee injury. Recent fall with generalized pain since. EXAM: LEFT KNEE - COMPLETE 4+ VIEW COMPARISON:  None. FINDINGS: No fracture or dislocation. Joint spaces are preserved. Mild tricompartmental peripheral spurring. Small knee joint effusion. There may be a small ossified intra-articular body in the posteromedial joint. IMPRESSION: 1. No fracture or dislocation of the left knee. 2. 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 Possible small ossified intra-articular body. Electronically Signed    By: 10/27/2020 M.D.   On: 10/25/2020 23:51   10/27/2020 LIMITED JOINT SPACE STRUCTURES LOW LEFT(NO LINKED CHARGES)  Result Date: 11/04/2020 Procedure: Real-time Ultrasound Guided Injection of left knee superior lateral patellar space Device: Philips Affiniti 50G Images permanently stored and available for review in PACS Ultrasound evaluation prior to injection reveals a significantly degenerative lateral meniscus that is extruded with tear and parameniscal cyst.  Intact LCL.  Small Baker's cyst present. Verbal informed consent obtained.  Discussed risks and benefits of procedure. Warned about infection bleeding damage to structures skin hypopigmentation and fat atrophy among others. Patient expresses understanding and agreement Time-out conducted.   Noted no overlying erythema, induration, or other signs of local infection.   Skin prepped in a sterile fashion.   Local anesthesia: Topical Ethyl chloride.   With sterile technique and under real time ultrasound guidance:  40 mg of Kenalog and 2 mL of Marcaine injected into knee joint. Fluid seen entering the joint capsule.   Completed without difficulty   Pain immediately resolved suggesting accurate placement of the medication.   Advised to call if fevers/chills, erythema, induration, drainage, or persistent bleeding.   Images permanently stored and available for review in the ultrasound unit. Impression: Technically successful ultrasound guided injection.     I, 11/06/2020, personally (independently) visualized and performed the interpretation of the xray images attached in this note.      Assessment and Plan: 70 y.o. 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  She had moderate benefit with steroid injection and compression knee sleeve but does not fully well enough to return to normal activity including running.  Discussed options.  She would like to continue conservative management  strategies.  Will refer to [facility] representative for a lateral off loader knee brace.  She does have a little bit of instability on exam today which should work quite well with the off loader brace as well.  If this is not sufficient would recommend MRI as next step.  We will go ahead and authorize now the hyaluronic acid injection as well as that may be a reasonable next step in the future.   Discussed warning signs or symptoms. Please see discharge instructions. Patient expresses understanding.   The above documentation has been reviewed and is accurate and complete Clementeen Graham, M.D.   Total encounter time 20 minutes including face-to-face time with the patient and, reviewing past medical record, and charting on the date of service.   Treatment plan and options

## 2020-12-11 ENCOUNTER — Other Ambulatory Visit: Payer: Self-pay

## 2020-12-11 ENCOUNTER — Ambulatory Visit: Payer: Medicare PPO | Admitting: Family Medicine

## 2020-12-11 VITALS — BP 132/82 | HR 87 | Ht 64.0 in | Wt 136.0 lb

## 2020-12-11 DIAGNOSIS — M25562 Pain in left knee: Secondary | ICD-10-CM

## 2020-12-11 DIAGNOSIS — M1712 Unilateral primary osteoarthritis, left knee: Secondary | ICD-10-CM | POA: Diagnosis not present

## 2020-12-11 NOTE — Patient Instructions (Addendum)
Thank you for coming in today.   Lets try the Irena Cords offloader knee brace.   If not ok then next step is either gel shots or MRI.   Let me know how you do.   Call [place] back. Give it 4-6 weeks.

## 2020-12-16 DIAGNOSIS — M1712 Unilateral primary osteoarthritis, left knee: Secondary | ICD-10-CM | POA: Diagnosis not present

## 2020-12-29 DIAGNOSIS — H25011 Cortical age-related cataract, right eye: Secondary | ICD-10-CM | POA: Diagnosis not present

## 2020-12-29 DIAGNOSIS — H16223 Keratoconjunctivitis sicca, not specified as Sjogren's, bilateral: Secondary | ICD-10-CM | POA: Diagnosis not present

## 2021-01-08 DIAGNOSIS — Z808 Family history of malignant neoplasm of other organs or systems: Secondary | ICD-10-CM | POA: Diagnosis not present

## 2021-01-08 DIAGNOSIS — Z85820 Personal history of malignant melanoma of skin: Secondary | ICD-10-CM | POA: Diagnosis not present

## 2021-01-08 DIAGNOSIS — L821 Other seborrheic keratosis: Secondary | ICD-10-CM | POA: Diagnosis not present

## 2021-01-08 DIAGNOSIS — D2272 Melanocytic nevi of left lower limb, including hip: Secondary | ICD-10-CM | POA: Diagnosis not present

## 2021-01-08 DIAGNOSIS — Z85828 Personal history of other malignant neoplasm of skin: Secondary | ICD-10-CM | POA: Diagnosis not present

## 2021-01-08 DIAGNOSIS — D2261 Melanocytic nevi of right upper limb, including shoulder: Secondary | ICD-10-CM | POA: Diagnosis not present

## 2021-01-08 DIAGNOSIS — L578 Other skin changes due to chronic exposure to nonionizing radiation: Secondary | ICD-10-CM | POA: Diagnosis not present

## 2021-01-08 DIAGNOSIS — L814 Other melanin hyperpigmentation: Secondary | ICD-10-CM | POA: Diagnosis not present

## 2021-01-08 DIAGNOSIS — D225 Melanocytic nevi of trunk: Secondary | ICD-10-CM | POA: Diagnosis not present

## 2021-03-18 ENCOUNTER — Encounter: Payer: Self-pay | Admitting: Internal Medicine

## 2021-03-19 ENCOUNTER — Other Ambulatory Visit: Payer: Self-pay | Admitting: Obstetrics and Gynecology

## 2021-03-19 DIAGNOSIS — Z1231 Encounter for screening mammogram for malignant neoplasm of breast: Secondary | ICD-10-CM

## 2021-04-21 ENCOUNTER — Ambulatory Visit
Admission: RE | Admit: 2021-04-21 | Discharge: 2021-04-21 | Disposition: A | Discharge status: Home / Self Care | Payer: Medicare PPO | Source: Ambulatory Visit | Attending: Obstetrics and Gynecology | Admitting: Obstetrics and Gynecology

## 2021-04-21 ENCOUNTER — Other Ambulatory Visit: Payer: Self-pay

## 2021-04-21 DIAGNOSIS — Z1231 Encounter for screening mammogram for malignant neoplasm of breast: Secondary | ICD-10-CM | POA: Diagnosis not present

## 2021-06-09 DIAGNOSIS — H16142 Punctate keratitis, left eye: Secondary | ICD-10-CM | POA: Diagnosis not present

## 2021-06-15 DIAGNOSIS — Z13228 Encounter for screening for other metabolic disorders: Secondary | ICD-10-CM | POA: Diagnosis not present

## 2021-06-15 DIAGNOSIS — E559 Vitamin D deficiency, unspecified: Secondary | ICD-10-CM | POA: Diagnosis not present

## 2021-06-15 DIAGNOSIS — Z6824 Body mass index (BMI) 24.0-24.9, adult: Secondary | ICD-10-CM | POA: Diagnosis not present

## 2021-06-15 DIAGNOSIS — Z13220 Encounter for screening for lipoid disorders: Secondary | ICD-10-CM | POA: Diagnosis not present

## 2021-06-15 DIAGNOSIS — Z01419 Encounter for gynecological examination (general) (routine) without abnormal findings: Secondary | ICD-10-CM | POA: Diagnosis not present

## 2021-06-15 DIAGNOSIS — M818 Other osteoporosis without current pathological fracture: Secondary | ICD-10-CM | POA: Diagnosis not present

## 2021-06-15 DIAGNOSIS — Z1329 Encounter for screening for other suspected endocrine disorder: Secondary | ICD-10-CM | POA: Diagnosis not present

## 2021-06-15 DIAGNOSIS — Z1321 Encounter for screening for nutritional disorder: Secondary | ICD-10-CM | POA: Diagnosis not present

## 2021-06-15 DIAGNOSIS — E785 Hyperlipidemia, unspecified: Secondary | ICD-10-CM | POA: Diagnosis not present

## 2021-07-02 ENCOUNTER — Other Ambulatory Visit: Payer: Self-pay

## 2021-07-02 ENCOUNTER — Ambulatory Visit (INDEPENDENT_AMBULATORY_CARE_PROVIDER_SITE_OTHER): Payer: Medicare PPO

## 2021-07-02 DIAGNOSIS — Z0000 Encounter for general adult medical examination without abnormal findings: Secondary | ICD-10-CM

## 2021-07-02 NOTE — Progress Notes (Addendum)
I connected with Kristi Alvarado today by telephone and verified that I am speaking with the correct person using two identifiers. Location patient: home Location provider: work Persons participating in the virtual visit: patient, provider.   I discussed the limitations, risks, security and privacy concerns of performing an evaluation and management service by telephone and the availability of in person appointments. I also discussed with the patient that there may be a patient responsible charge related to this service. The patient expressed understanding and verbally consented to this telephonic visit.    Interactive audio and video telecommunications were attempted between this provider and patient, however failed, due to patient having technical difficulties OR patient did not have access to video capability.  We continued and completed visit with audio only.  Some vital signs may be absent or patient reported.   Time Spent with patient on telephone encounter: 40 minutes  Subjective:   Kristi Alvarado is a 71 y.o. female who presents for Medicare Annual (Subsequent) preventive examination.  Review of Systems     Cardiac Risk Factors include: advanced age (>755men, 35>65 women);family history of premature cardiovascular disease     Objective:    There were no vitals filed for this visit. There is no height or weight on file to calculate BMI.  Advanced Directives 07/02/2021 12/02/2017  Does Patient Have a Medical Advance Directive? Yes Yes  Type of Advance Directive Living will;Healthcare Power of [employer] Healthcare Power of Attorney  Does patient want to make changes to medical advance directive? No - Patient declined -  Copy of Healthcare Power of Attorney in Chart? No - copy requested -    Current Medications (verified) Outpatient Encounter Medications as of 07/02/2021  Medication Sig   BIOTIN 5000 PO Take 1 tablet by mouth daily.   fexofenadine (ALLEGRA) 180 MG tablet Take 1  tablet by mouth daily.   fluticasone (FLONASE) 50 MCG/ACT nasal spray Place 1 spray into the nose daily.   glucosamine-chondroitin 500-400 MG tablet Take 1 tablet by mouth 3 (three) times daily.   NON FORMULARY Place 1 drop into the left eye in the morning and at bedtime. Renger-Eyes   Omega-3 Fatty Acids (OMEGA 3 PO) Take by mouth.   PRESCRIPTION MEDICATION Regenereyes 1drop left eye twice daily   scopolamine (TRANSDERM-SCOP, 1.5 MG,) 1 MG/3DAYS Place 1 patch (1.5 mg total) onto the skin every 3 (three) days.   sertraline (ZOLOFT) 50 MG tablet sertraline 50 mg tablet  TAKE 1 TABLET BY MOUTH EVERY DAY FOR 30 DAYS   No facility-administered encounter medications on file as of 07/02/2021.    Allergies (verified) Patient has no known allergies.   History: Past Medical History:  Diagnosis Date   Allergy    Past Surgical History:  Procedure Laterality Date   BREAST BIOPSY Right    removal benign adenoma right breast   BTL     COLONOSCOPY  [place] yrs ago    [place]"   Family History  Problem Relation Age of Onset   Heart disease Mother    Heart disease Father    Cancer Maternal Grandmother        colon   Colon cancer Maternal Grandmother    Esophageal cancer Neg Hx    Stomach cancer Neg Hx    Breast cancer Neg Hx    Social History   Socioeconomic History   Marital status: Married    Spouse name: Not on file   Number of children: Not on file  Years of education: Not on file   Highest education level: Not on file  Occupational History   Not on file  Tobacco Use   Smoking status: Never   Smokeless tobacco: Never  Vaping Use   Vaping Use: Never used  Substance and Sexual Activity   Alcohol use: Yes    Alcohol/week: 0.0 standard drinks    Comment: social/maybe 2 times per month per pt   Drug use: No   Sexual activity: Not on file  Other Topics Concern   Not on file  Social History Narrative   Not on file   Social Determinants of Health   Financial  Resource Strain: Low Risk    Difficulty of Paying Living Expenses: Not hard at all  Food Insecurity: No Food Insecurity   Worried About Programme researcher, broadcasting/film/videounning Out of Food in the Last Year: Never true   Ran Out of Food in the Last Year: Never true  Transportation Needs: No Transportation Needs   Lack of Transportation (Medical): No   Lack of Transportation (Non-Medical): No  Physical Activity: Sufficiently Active   Days of Exercise per Week: 3 days   Minutes of Exercise per Session: 60 min  Stress: No Stress Concern Present   Feeling of Stress : Not at all  Social Connections: Unknown   Frequency of Communication with Friends and Family: More than three times a week   Frequency of Social Gatherings with Friends and Family: Once a week   Attends Religious Services: Patient refused   Database administratorActive Member of Clubs or Organizations: Patient refused   Attends Engineer, structuralClub or Organization Meetings: Patient refused   Marital Status: Married    Tobacco Counseling Counseling given: Not Answered   Clinical Intake:  Pre-visit preparation completed: Yes  Pain : No/denies pain     Nutritional Risks: None Diabetes: No  How often do you need to have someone help you when you read instructions, pamphlets, or other written materials from your doctor or pharmacy?: 1 - Never What is the last grade level you completed in school?: Asspciate's Degree  Diabetic? no  Interpreter Needed?: No  Information entered by :: Venia MinksShenika Hatfiield, LPN   Activities of Daily Living In your present state of health, do you have any difficulty performing the following activities: 07/02/2021 10/24/2020  Hearing? N N  Vision? N N  Difficulty concentrating or making decisions? N N  Walking or climbing stairs? N N  Dressing or bathing? N N  Doing errands, shopping? N N  Preparing Food and eating ? N -  Using the Toilet? N -  In the past six months, have you accidently leaked urine? N -  Do you have problems with loss of bowel control?  N -  Managing your Medications? N -  Managing your Finances? N -  Housekeeping or managing your Housekeeping? N -  Some recent data might be hidden    Patient Care Team: Myrlene Brokerrawford, Elizabeth A, MD as PCP - General (Internal Medicine)  Indicate any recent Medical Services you may have received from other than [facility] providers in the past year (date may be approximate).     Assessment:   This is a routine wellness examination for Kristi Alvarado.  Hearing/Vision screen No results found.  Dietary issues and exercise activities discussed: Current Exercise Habits: Home exercise routine, Type of exercise: Other - see comments (running 3 miles every other day), Time (Minutes): 60, Frequency (Times/Week): 3, Weekly Exercise (Minutes/Week): 180, Intensity: Moderate, Exercise limited by: None identified   Goals Addressed  None   Depression Screen PHQ 2/9 Scores 07/02/2021 05/27/2020  PHQ - 2 Score 0 0    Fall Risk Fall Risk  07/02/2021 05/27/2020  Falls in the past year? 0 0  Number falls in past yr: 0 -  Injury with Fall? 0 -  Risk for fall due to : No Fall Risks -  Follow up Falls evaluation completed -    FALL RISK PREVENTION PERTAINING TO THE HOME:  Any stairs in or around the home? No  If so, are there any without handrails? No  Home free of loose throw rugs in walkways, pet beds, electrical cords, etc? Yes  Adequate lighting in your home to reduce risk of falls? Yes   ASSISTIVE DEVICES UTILIZED TO PREVENT FALLS:  Life alert? No  Use of a cane, walker or w/c? No  Grab bars in the bathroom? Yes  Shower chair or bench in shower? Yes  Elevated toilet seat or a handicapped toilet? Yes   TIMED UP AND GO:  Was the test performed? No .  Length of time to ambulate 10 feet: n/a sec.   Gait steady and fast without use of assistive device (per patient)  Cognitive Function: Normal cognitive status assessed by direct observation by this Nurse Health Advisor. No abnormalities found.           Immunizations Immunization History  Administered Date(s) Administered   Fluad Quad(high Dose 65+) 03/25/2020   Influenza, High Dose Seasonal PF 03/19/2017, 03/22/2019   Influenza,inj,Quad PF,6+ Mos 03/21/2014   Influenza-Unspecified 02/11/2016, 02/18/2018, 03/22/2019   PFIZER(Purple Top)SARS-COV-2 Vaccination 06/26/2019, 07/17/2019, 03/04/2020, 10/23/2020   PPD Test 05/27/2020   Pfizer Covid-19 Vaccine Bivalent Booster 31yrs & up 03/18/2021   Pneumococcal Conjugate-13 01/27/2018   Pneumococcal Polysaccharide-23 02/21/2019   Tdap 03/22/2016   Zoster Recombinat (Shingrix) 01/27/2018, 04/27/2018   Zoster, Live 10/31/2014    TDAP status: Up to date  Flu Vaccine status: Up to date  Pneumococcal vaccine status: Up to date  Covid-19 vaccine status: Completed vaccines  Qualifies for Shingles Vaccine? Yes   Zostavax completed Yes   Shingrix Completed?: Yes  Screening Tests Health Maintenance  Topic Date Due   DEXA SCAN  Never done   MAMMOGRAM  04/22/2023   TETANUS/TDAP  03/22/2026   COLONOSCOPY (Pts 45-47yrs Insurance coverage will need to be confirmed)  12/03/2027   Pneumonia Vaccine 54+ Years old  Completed   INFLUENZA VACCINE  Completed   COVID-19 Vaccine  Completed   Hepatitis C Screening  Completed   Zoster Vaccines- Shingrix  Completed   HPV VACCINES  Aged Out    Health Maintenance  Health Maintenance Due  Topic Date Due   DEXA SCAN  Never done    Colorectal cancer screening: Type of screening: Colonoscopy. Completed 12/02/2017. Repeat every 10 years  Mammogram status: Completed 04/21/2021. Repeat every year  Bone Density status: Done by OB/GYN; advised to schedule for 02/18/2023 due to osteopenia.  Lung Cancer Screening: (Low Dose CT Chest recommended if Age 75-80 years, 30 pack-year currently smoking OR have quit w/in 15years.) does not qualify.   Lung Cancer Screening Referral: no  Additional Screening:  Hepatitis C Screening: does qualify;  Completed yes  Vision Screening: Recommended annual ophthalmology exams for early detection of glaucoma and other disorders of the eye. Is the patient up to date with their annual eye exam?  Yes  Who is the provider or what is the name of the office in which the patient attends annual eye exams? Gelene Mink,  OD. If pt is not established with a provider, would they like to be referred to a provider to establish care? No .   Dental Screening: Recommended annual dental exams for proper oral hygiene  Community Resource Referral / Chronic Care Management: CRR required this visit?  No   CCM required this visit?  No      Plan:     I have personally reviewed and noted the following in the patients chart:   Medical and social history Use of alcohol, tobacco or illicit drugs  Current medications and supplements including opioid prescriptions.  Functional ability and status Nutritional status Physical activity Advanced directives List of other physicians Hospitalizations, surgeries, and ER visits in previous 12 months Vitals Screenings to include cognitive, depression, and falls Referrals and appointments  In addition, I have reviewed and discussed with patient certain preventive protocols, quality metrics, and best practice recommendations. A written personalized care plan for preventive services as well as general preventive health recommendations were provided to patient.     Mickeal Needy, LPN   3/35/4562   Nurse Notes:  Patient is cogitatively intact. There were no vitals filed for this visit. There is no height or weight on file to calculate BMI. Patient stated that she has no issues with gait or balance; does not use any assistive devices. Medications reviewed with patient; no opioid use noted.   Medical screening examination/treatment/procedure(s) were performed by non-physician practitioner and as supervising physician I was immediately available for  consultation/collaboration.  I agree with above. Jacinta Shoe, MD

## 2021-07-27 ENCOUNTER — Encounter: Payer: Self-pay | Admitting: Internal Medicine

## 2021-07-28 NOTE — Telephone Encounter (Signed)
Appointment scheduled.

## 2021-07-30 ENCOUNTER — Ambulatory Visit (INDEPENDENT_AMBULATORY_CARE_PROVIDER_SITE_OTHER): Payer: Medicare PPO

## 2021-07-30 ENCOUNTER — Other Ambulatory Visit: Payer: Self-pay

## 2021-07-30 DIAGNOSIS — Z111 Encounter for screening for respiratory tuberculosis: Secondary | ICD-10-CM

## 2021-07-30 NOTE — Progress Notes (Signed)
PPD has been placed w/o any complications.

## 2021-07-31 LAB — TB SKIN TEST
Induration: NEGATIVE mm
TB Skin Test: NEGATIVE

## 2021-08-17 NOTE — Progress Notes (Deleted)
Letter taken care of

## 2022-01-04 DIAGNOSIS — H16223 Keratoconjunctivitis sicca, not specified as Sjogren's, bilateral: Secondary | ICD-10-CM | POA: Diagnosis not present

## 2022-01-04 DIAGNOSIS — H11432 Conjunctival hyperemia, left eye: Secondary | ICD-10-CM | POA: Diagnosis not present

## 2022-01-04 DIAGNOSIS — H0288A Meibomian gland dysfunction right eye, upper and lower eyelids: Secondary | ICD-10-CM | POA: Diagnosis not present

## 2022-01-04 DIAGNOSIS — H0288B Meibomian gland dysfunction left eye, upper and lower eyelids: Secondary | ICD-10-CM | POA: Diagnosis not present

## 2022-01-04 DIAGNOSIS — H25042 Posterior subcapsular polar age-related cataract, left eye: Secondary | ICD-10-CM | POA: Diagnosis not present

## 2022-01-04 DIAGNOSIS — H2511 Age-related nuclear cataract, right eye: Secondary | ICD-10-CM | POA: Diagnosis not present

## 2022-01-07 DIAGNOSIS — L570 Actinic keratosis: Secondary | ICD-10-CM | POA: Diagnosis not present

## 2022-01-07 DIAGNOSIS — L821 Other seborrheic keratosis: Secondary | ICD-10-CM | POA: Diagnosis not present

## 2022-01-07 DIAGNOSIS — Z808 Family history of malignant neoplasm of other organs or systems: Secondary | ICD-10-CM | POA: Diagnosis not present

## 2022-01-07 DIAGNOSIS — D2261 Melanocytic nevi of right upper limb, including shoulder: Secondary | ICD-10-CM | POA: Diagnosis not present

## 2022-01-07 DIAGNOSIS — Z85828 Personal history of other malignant neoplasm of skin: Secondary | ICD-10-CM | POA: Diagnosis not present

## 2022-01-07 DIAGNOSIS — L814 Other melanin hyperpigmentation: Secondary | ICD-10-CM | POA: Diagnosis not present

## 2022-01-07 DIAGNOSIS — D2272 Melanocytic nevi of left lower limb, including hip: Secondary | ICD-10-CM | POA: Diagnosis not present

## 2022-01-07 DIAGNOSIS — D225 Melanocytic nevi of trunk: Secondary | ICD-10-CM | POA: Diagnosis not present

## 2022-01-07 DIAGNOSIS — Z85820 Personal history of malignant melanoma of skin: Secondary | ICD-10-CM | POA: Diagnosis not present

## 2022-02-16 DIAGNOSIS — D0471 Carcinoma in situ of skin of right lower limb, including hip: Secondary | ICD-10-CM | POA: Diagnosis not present

## 2022-02-16 DIAGNOSIS — D1801 Hemangioma of skin and subcutaneous tissue: Secondary | ICD-10-CM | POA: Diagnosis not present

## 2022-02-16 DIAGNOSIS — D485 Neoplasm of uncertain behavior of skin: Secondary | ICD-10-CM | POA: Diagnosis not present

## 2022-02-24 DIAGNOSIS — C44722 Squamous cell carcinoma of skin of right lower limb, including hip: Secondary | ICD-10-CM | POA: Diagnosis not present

## 2022-03-12 NOTE — Progress Notes (Deleted)
Letter complete.

## 2022-03-25 ENCOUNTER — Encounter: Payer: Self-pay | Admitting: Internal Medicine

## 2022-03-26 ENCOUNTER — Other Ambulatory Visit: Payer: Self-pay | Admitting: Obstetrics and Gynecology

## 2022-03-26 DIAGNOSIS — Z1231 Encounter for screening mammogram for malignant neoplasm of breast: Secondary | ICD-10-CM

## 2022-05-03 DIAGNOSIS — H16223 Keratoconjunctivitis sicca, not specified as Sjogren's, bilateral: Secondary | ICD-10-CM | POA: Diagnosis not present

## 2022-05-03 DIAGNOSIS — H0288A Meibomian gland dysfunction right eye, upper and lower eyelids: Secondary | ICD-10-CM | POA: Diagnosis not present

## 2022-05-03 DIAGNOSIS — H0288B Meibomian gland dysfunction left eye, upper and lower eyelids: Secondary | ICD-10-CM | POA: Diagnosis not present

## 2022-05-13 IMAGING — MG MM DIGITAL SCREENING BILAT W/ TOMO AND CAD
8 series · 9 of 24 positions shown · non-contrast
Comparison: Previous exam(s).

CLINICAL DATA: Screening.

EXAM:
DIGITAL SCREENING BILATERAL MAMMOGRAM WITH TOMOSYNTHESIS AND CAD
TECHNIQUE: Bilateral screening digital craniocaudal and mediolateral oblique
mammograms were obtained. Bilateral screening digital breast
tomosynthesis was performed. The images were evaluated with
computer-aided detection.

[L CC synth-2D]
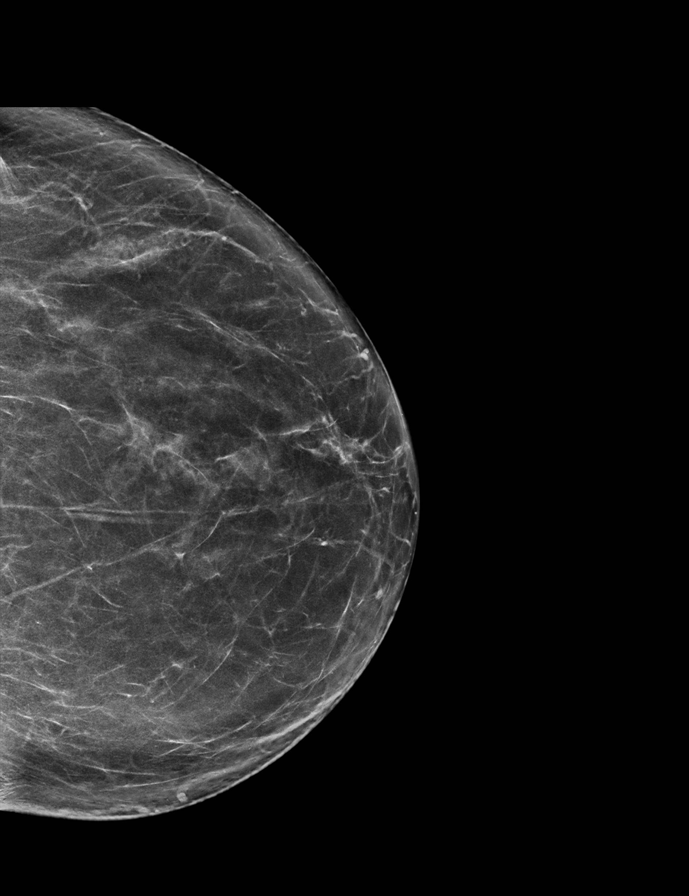

[L MLO synth-2D]
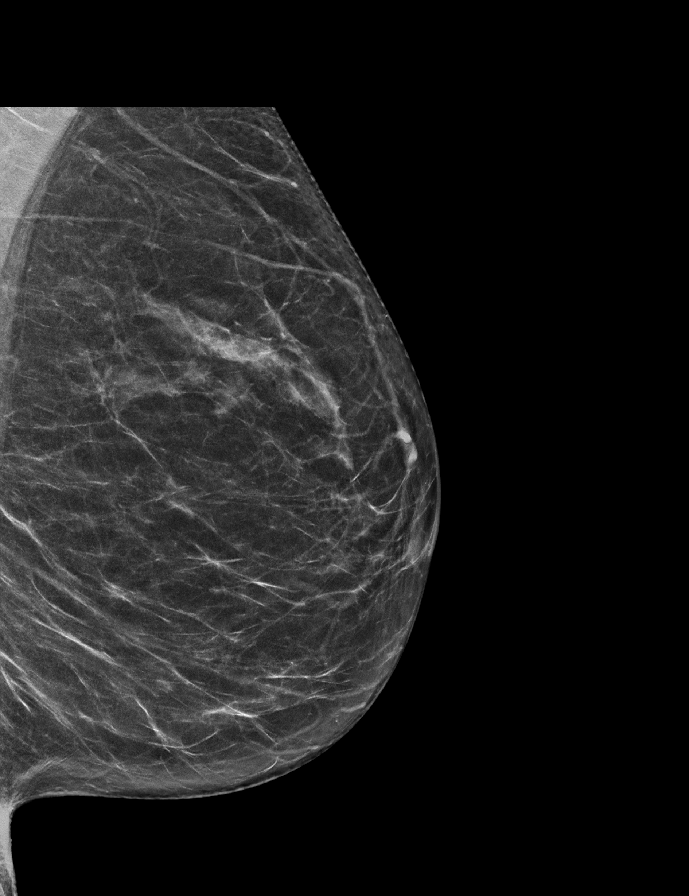

[R CC synth-2D]
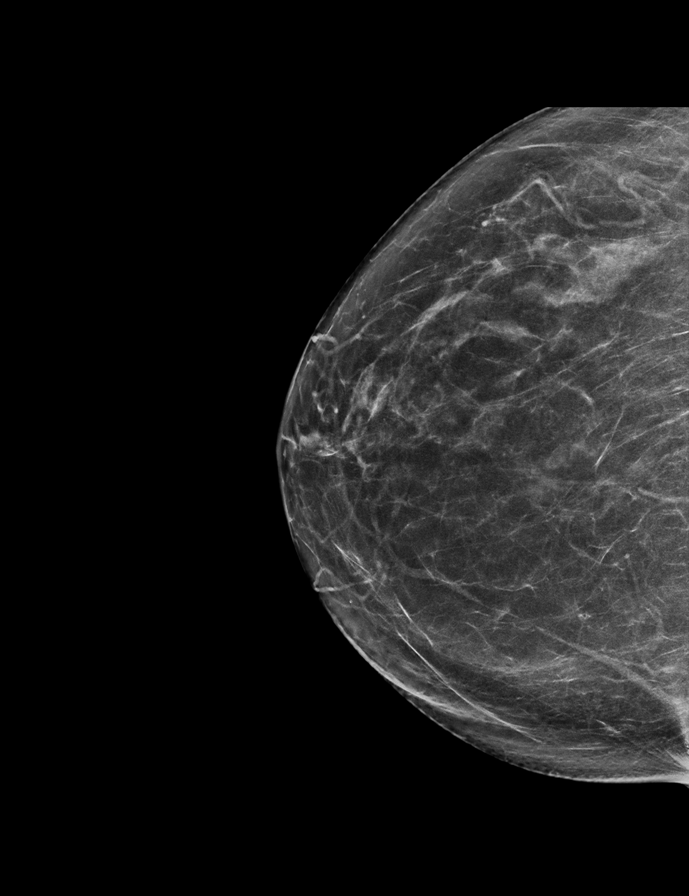

[R MLO synth-2D]
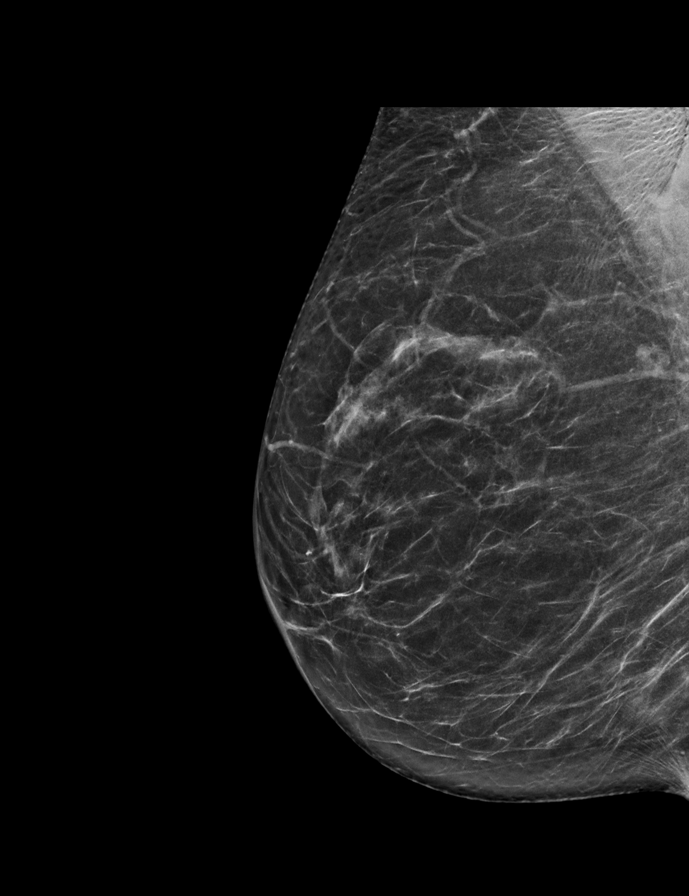

[R MLO tomo · 2 of 65 frames shown]
[frame 21/65]
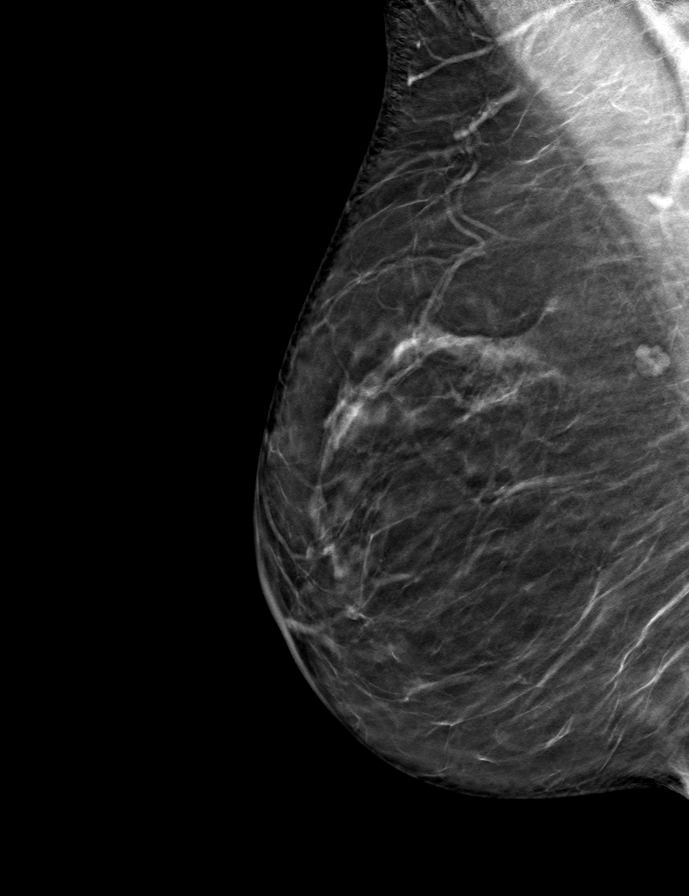
[frame 33/65]
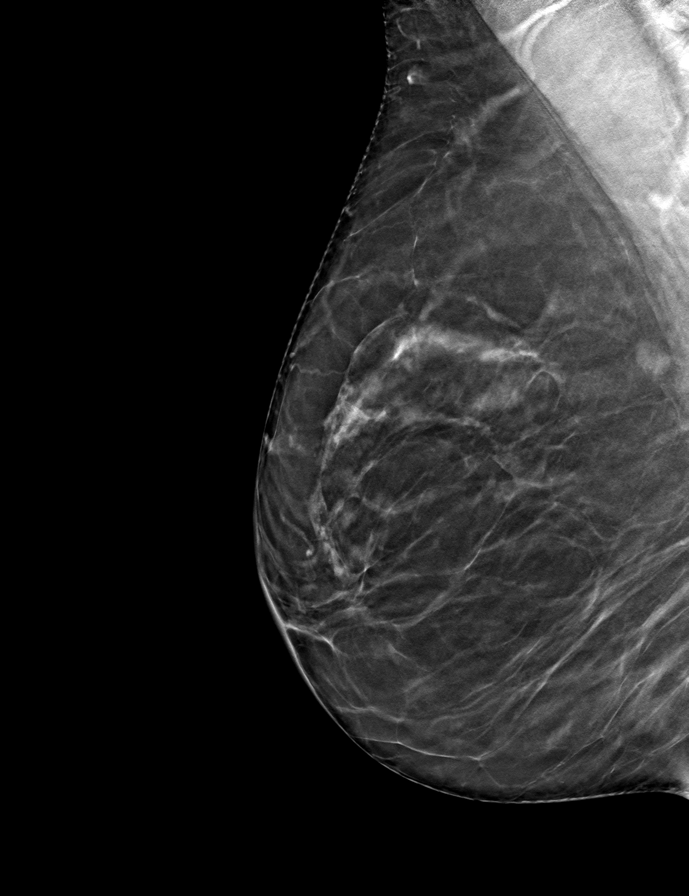

[R CC tomo · tomo slice 34/67.0]
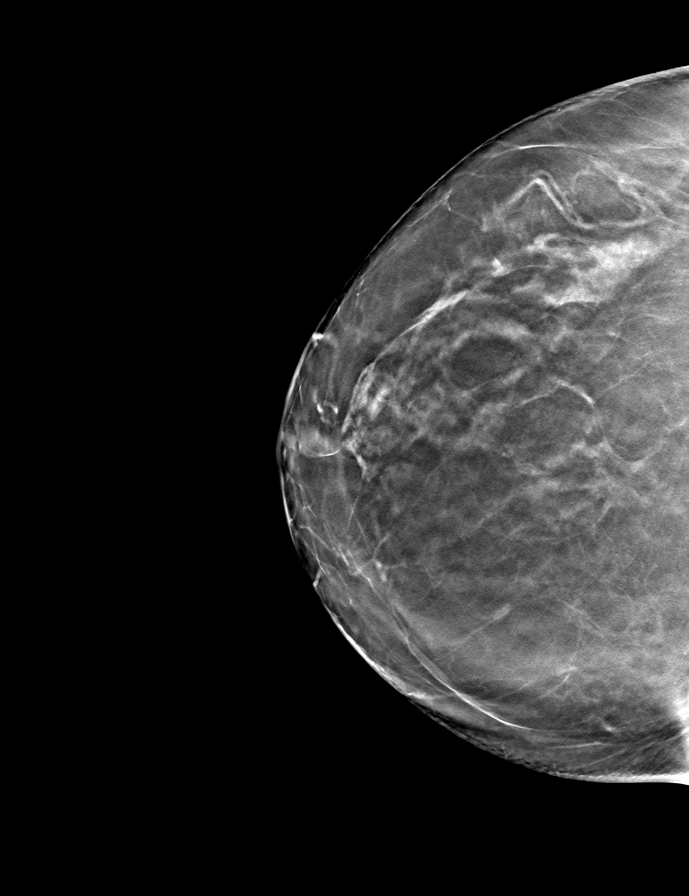

[L MLO tomo · tomo slice 32/63.0]
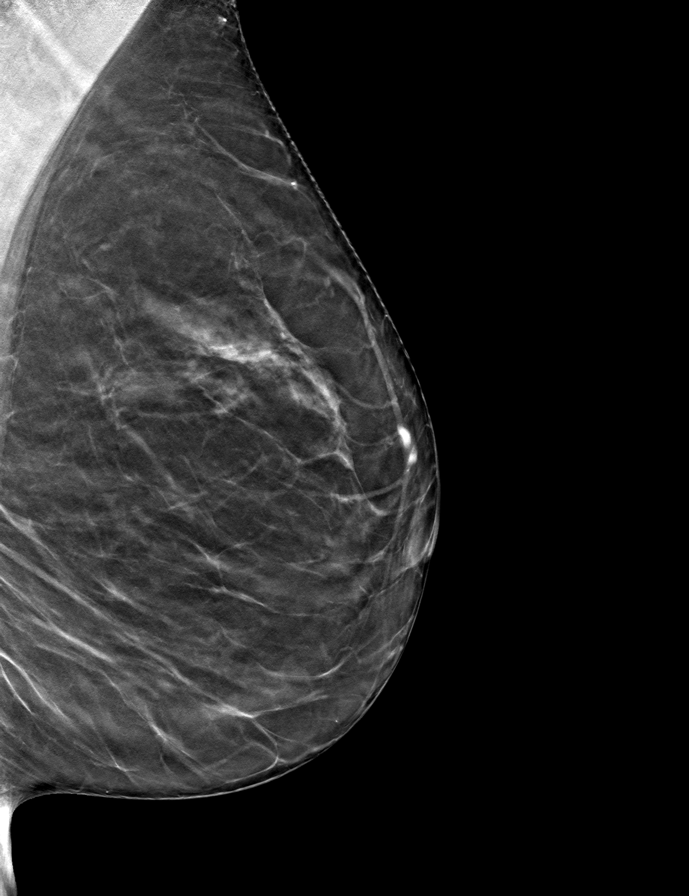

[L CC tomo · tomo slice 34/67.0]
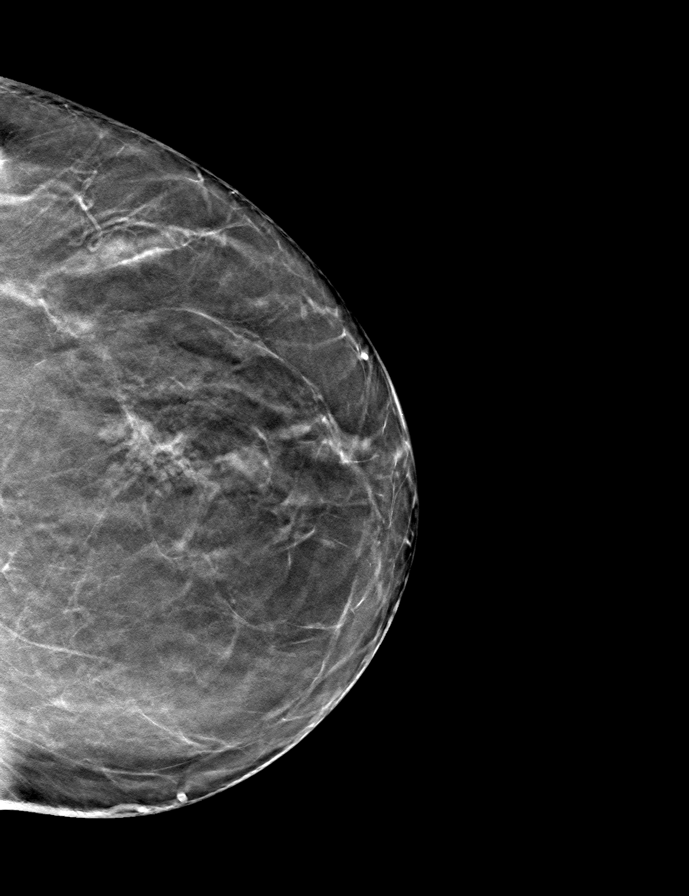

[9 of 24 positions shown; findings below may reference images not displayed]

ACR Breast Density Category b: There are scattered areas of
fibroglandular density.
FINDINGS: There are no findings suspicious for malignancy.
IMPRESSION: No mammographic evidence of malignancy. A result letter of this
screening mammogram will be mailed directly to the patient.

RECOMMENDATION:
Screening mammogram in one year. (Code:51-O-LD2)

BI-RADS CATEGORY  1: Negative.

## 2022-05-18 ENCOUNTER — Ambulatory Visit
Admission: RE | Admit: 2022-05-18 | Discharge: 2022-05-18 | Disposition: A | Discharge status: Home / Self Care | Payer: Medicare PPO | Source: Ambulatory Visit | Attending: Obstetrics and Gynecology | Admitting: Obstetrics and Gynecology

## 2022-05-18 DIAGNOSIS — Z1231 Encounter for screening mammogram for malignant neoplasm of breast: Secondary | ICD-10-CM | POA: Diagnosis not present

## 2022-05-25 DIAGNOSIS — Z8589 Personal history of malignant neoplasm of other organs and systems: Secondary | ICD-10-CM | POA: Diagnosis not present

## 2022-05-25 DIAGNOSIS — Z5189 Encounter for other specified aftercare: Secondary | ICD-10-CM | POA: Diagnosis not present

## 2022-06-17 ENCOUNTER — Other Ambulatory Visit: Payer: Self-pay | Admitting: Obstetrics and Gynecology

## 2022-06-17 DIAGNOSIS — D485 Neoplasm of uncertain behavior of skin: Secondary | ICD-10-CM | POA: Diagnosis not present

## 2022-06-17 DIAGNOSIS — Z01419 Encounter for gynecological examination (general) (routine) without abnormal findings: Secondary | ICD-10-CM | POA: Diagnosis not present

## 2022-06-17 DIAGNOSIS — Z8249 Family history of ischemic heart disease and other diseases of the circulatory system: Secondary | ICD-10-CM

## 2022-06-17 DIAGNOSIS — Z6825 Body mass index (BMI) 25.0-25.9, adult: Secondary | ICD-10-CM | POA: Diagnosis not present

## 2022-06-17 DIAGNOSIS — B078 Other viral warts: Secondary | ICD-10-CM | POA: Diagnosis not present

## 2022-06-29 DIAGNOSIS — Z131 Encounter for screening for diabetes mellitus: Secondary | ICD-10-CM | POA: Diagnosis not present

## 2022-06-29 DIAGNOSIS — Z1329 Encounter for screening for other suspected endocrine disorder: Secondary | ICD-10-CM | POA: Diagnosis not present

## 2022-06-29 DIAGNOSIS — Z13228 Encounter for screening for other metabolic disorders: Secondary | ICD-10-CM | POA: Diagnosis not present

## 2022-06-29 DIAGNOSIS — Z1321 Encounter for screening for nutritional disorder: Secondary | ICD-10-CM | POA: Diagnosis not present

## 2022-06-29 DIAGNOSIS — E7800 Pure hypercholesterolemia, unspecified: Secondary | ICD-10-CM | POA: Diagnosis not present

## 2022-06-29 DIAGNOSIS — E559 Vitamin D deficiency, unspecified: Secondary | ICD-10-CM | POA: Diagnosis not present

## 2022-06-29 DIAGNOSIS — Z13220 Encounter for screening for lipoid disorders: Secondary | ICD-10-CM | POA: Diagnosis not present

## 2022-07-07 ENCOUNTER — Ambulatory Visit
Admission: RE | Admit: 2022-07-07 | Discharge: 2022-07-07 | Disposition: A | Discharge status: Home / Self Care | Payer: Medicare PPO | Source: Ambulatory Visit | Attending: Obstetrics and Gynecology | Admitting: Obstetrics and Gynecology

## 2022-07-07 ENCOUNTER — Other Ambulatory Visit: Payer: Self-pay

## 2022-07-07 DIAGNOSIS — Z8249 Family history of ischemic heart disease and other diseases of the circulatory system: Secondary | ICD-10-CM

## 2022-07-07 DIAGNOSIS — Z136 Encounter for screening for cardiovascular disorders: Secondary | ICD-10-CM | POA: Diagnosis not present

## 2022-07-07 DIAGNOSIS — R058 Other specified cough: Secondary | ICD-10-CM

## 2022-07-13 DIAGNOSIS — E785 Hyperlipidemia, unspecified: Secondary | ICD-10-CM | POA: Diagnosis not present

## 2022-08-10 DIAGNOSIS — H16223 Keratoconjunctivitis sicca, not specified as Sjogren's, bilateral: Secondary | ICD-10-CM | POA: Diagnosis not present

## 2022-09-30 ENCOUNTER — Telehealth: Payer: Self-pay | Admitting: Internal Medicine

## 2022-09-30 NOTE — Telephone Encounter (Signed)
Contacted Kristi Alvarado to schedule their annual wellness visit. Appointment made for 10/11/2022.  [facility] AWV TEAM Direct Dial: [phone]

## 2022-10-11 ENCOUNTER — Telehealth: Payer: Self-pay

## 2022-10-11 ENCOUNTER — Ambulatory Visit (INDEPENDENT_AMBULATORY_CARE_PROVIDER_SITE_OTHER): Payer: Medicare PPO

## 2022-10-11 VITALS — Ht 64.0 in | Wt 135.0 lb

## 2022-10-11 DIAGNOSIS — Z0000 Encounter for general adult medical examination without abnormal findings: Secondary | ICD-10-CM

## 2022-10-11 NOTE — Telephone Encounter (Signed)
Please schedule patient an appointment to see Dr. Okey Dupre. Last OV was 10/24/2020.  Kristi Sandoval N. Luisdaniel Kenton, LPN. [facility] AWV Team Direct Dial: [phone]

## 2022-10-11 NOTE — Patient Instructions (Addendum)
Kristi Alvarado , Thank you for taking time to come for your Medicare Wellness Visit. I appreciate your ongoing commitment to your health goals. Please review the following plan we discussed and let me know if I can assist you in the future.   These are the goals we discussed:  Goals      My goal for 2024 is to maintain my health and stay physically active.        This is a list of the screening recommended for you and due dates:  Health Maintenance  Topic Date Due   COVID-19 Vaccine (6 - 2023-24 season) 02/05/2022   Flu Shot  01/06/2023   Medicare Annual Wellness Visit  10/11/2023   Mammogram  05/18/2024   DTaP/Tdap/Td vaccine (2 - Td or Tdap) 03/22/2026   Colon Cancer Screening  12/03/2027   Pneumonia Vaccine  Completed   DEXA scan (bone density measurement)  Completed   Hepatitis C Screening: USPSTF Recommendation to screen - Ages 27-79 yo.  Completed   Zoster (Shingles) Vaccine  Completed   HPV Vaccine  Aged Out    Advanced directives: Yes  Conditions/risks identified: Yes  Next appointment: Follow up in one year for your annual wellness visit.   Preventive Care 71 Years and Older, Female Preventive care refers to lifestyle choices and visits with your health care provider that can promote health and wellness. What does preventive care include? A yearly physical exam. This is also called an annual well check. Dental exams once or twice a year. Routine eye exams. Ask your health care provider how often you should have your eyes checked. Personal lifestyle choices, including: Daily care of your teeth and gums. Regular physical activity. Eating a healthy diet. Avoiding tobacco and drug use. Limiting alcohol use. Practicing safe sex. Taking low-dose aspirin every day. Taking vitamin and mineral supplements as recommended by your health care provider. What happens during an annual well check? The services and screenings done by your health care provider during your annual  well check will depend on your age, overall health, lifestyle risk factors, and family history of disease. Counseling  Your health care provider may ask you questions about your: Alcohol use. Tobacco use. Drug use. Emotional well-being. Home and relationship well-being. Sexual activity. Eating habits. History of falls. Memory and ability to understand (cognition). Work and work Astronomer. Reproductive health. Screening  You may have the following tests or measurements: Height, weight, and BMI. Blood pressure. Lipid and cholesterol levels. These may be checked every 5 years, or more frequently if you are 71 years old. Skin check. Lung cancer screening. You may have this screening every year starting at age 71 if you have a 30-pack-year history of smoking and currently smoke or have quit within the past 15 years. Fecal occult blood test (FOBT) of the stool. You may have this test every year starting at age 71. Flexible sigmoidoscopy or colonoscopy. You may have a sigmoidoscopy every 5 years or a colonoscopy every 10 years starting at age 71. Hepatitis C blood test. Hepatitis B blood test. Sexually transmitted disease (STD) testing. Diabetes screening. This is done by checking your blood sugar (glucose) after you have not eaten for a while (fasting). You may have this done every 1-3 years. Bone density scan. This is done to screen for osteoporosis. You may have this done starting at age 71. Mammogram. This may be done every 1-2 years. Talk to your health care provider about how often you should have regular mammograms.  Talk with your health care provider about your test results, treatment options, and if necessary, the need for more tests. Vaccines  Your health care provider may recommend certain vaccines, such as: Influenza vaccine. This is recommended every year. Tetanus, diphtheria, and acellular pertussis (Tdap, Td) vaccine. You may need a Td booster every 10 years. Zoster  vaccine. You may need this after age 71. Pneumococcal 13-valent conjugate (PCV13) vaccine. One dose is recommended after age 71. Pneumococcal polysaccharide (PPSV23) vaccine. One dose is recommended after age 71. Talk to your health care provider about which screenings and vaccines you need and how often you need them. This information is not intended to replace advice given to you by your health care provider. Make sure you discuss any questions you have with your health care provider. Document Released: 06/20/2015 Document Revised: 02/11/2016 Document Reviewed: 03/25/2015 Elsevier Interactive Patient Education  2017 [place] Prevention in the Home Falls can cause injuries. They can happen to people of all ages. There are many things you can do to make your home safe and to help prevent falls. What can I do on the outside of my home? Regularly fix the edges of walkways and driveways and fix any cracks. Remove anything that might make you trip as you walk through a door, such as a raised step or threshold. Trim any bushes or trees on the path to your home. Use bright outdoor lighting. Clear any walking paths of anything that might make someone trip, such as rocks or tools. Regularly check to see if handrails are loose or broken. Make sure that both sides of any steps have handrails. Any raised decks and porches should have guardrails on the edges. Have any leaves, snow, or ice cleared regularly. Use sand or salt on walking paths during winter. Clean up any spills in your garage right away. This includes oil or grease spills. What can I do in the bathroom? Use night lights. Install grab bars by the toilet and in the tub and shower. Do not use towel bars as grab bars. Use non-skid mats or decals in the tub or shower. If you need to sit down in the shower, use a plastic, non-slip stool. Keep the floor dry. Clean up any water that spills on the floor as soon as it happens. Remove  soap buildup in the tub or shower regularly. Attach bath mats securely with double-sided non-slip rug tape. Do not have throw rugs and other things on the floor that can make you trip. What can I do in the bedroom? Use night lights. Make sure that you have a light by your bed that is easy to reach. Do not use any sheets or blankets that are too big for your bed. They should not hang down onto the floor. Have a firm chair that has side arms. You can use this for support while you get dressed. Do not have throw rugs and other things on the floor that can make you trip. What can I do in the kitchen? Clean up any spills right away. Avoid walking on wet floors. Keep items that you use a lot in easy-to-reach places. If you need to reach something above you, use a strong step stool that has a grab bar. Keep electrical cords out of the way. Do not use floor polish or wax that makes floors slippery. If you must use wax, use non-skid floor wax. Do not have throw rugs and other things on the floor that can make  you trip. What can I do with my stairs? Do not leave any items on the stairs. Make sure that there are handrails on both sides of the stairs and use them. Fix handrails that are broken or loose. Make sure that handrails are as long as the stairways. Check any carpeting to make sure that it is firmly attached to the stairs. Fix any carpet that is loose or worn. Avoid having throw rugs at the top or bottom of the stairs. If you do have throw rugs, attach them to the floor with carpet tape. Make sure that you have a light switch at the top of the stairs and the bottom of the stairs. If you do not have them, ask someone to add them for you. What else can I do to help prevent falls? Wear shoes that: Do not have high heels. Have rubber bottoms. Are comfortable and fit you well. Are closed at the toe. Do not wear sandals. If you use a stepladder: Make sure that it is fully opened. Do not climb a  closed stepladder. Make sure that both sides of the stepladder are locked into place. Ask someone to hold it for you, if possible. Clearly mark and make sure that you can see: Any grab bars or handrails. First and last steps. Where the edge of each step is. Use tools that help you move around (mobility aids) if they are needed. These include: Canes. Walkers. Scooters. Crutches. Turn on the lights when you go into a dark area. Replace any light bulbs as soon as they burn out. Set up your furniture so you have a clear path. Avoid moving your furniture around. If any of your floors are uneven, fix them. If there are any pets around you, be aware of where they are. Review your medicines with your doctor. Some medicines can make you feel dizzy. This can increase your chance of falling. Ask your doctor what other things that you can do to help prevent falls. This information is not intended to replace advice given to you by your health care provider. Make sure you discuss any questions you have with your health care provider. Document Released: 03/20/2009 Document Revised: 10/30/2015 Document Reviewed: 06/28/2014 Elsevier Interactive Patient Education  2017 [employer].

## 2022-10-11 NOTE — Progress Notes (Signed)
I connected with  Annett Gula on 10/11/22 by a audio enabled telemedicine application and verified that I am speaking with the correct person using two identifiers.  Patient Location: Home  Provider Location: Office/Clinic  I discussed the limitations of evaluation and management by telemedicine. The patient expressed understanding and agreed to proceed.  Subjective:   Kristi Alvarado is a 72 y.o. female who presents for Medicare Annual (Subsequent) preventive examination.  Review of Systems     Cardiac Risk Factors include: advanced age (>35men, >103 women);family history of premature cardiovascular disease     Objective:    Today's Vitals   10/11/22 1004 10/11/22 1005  Weight: 135 lb (61.2 kg)   Height: 5\' 4"  (1.626 m)   PainSc: 0-No pain 0-No pain   Body mass index is 23.17 kg/m.     10/11/2022   10:08 AM 07/02/2021    1:06 PM 12/02/2017    8:02 AM  Advanced Directives  Does Patient Have a Medical Advance Directive? Yes Yes Yes  Type of Estate agent of [place];Living will Living will;Healthcare Power of [employer] Power of Attorney  Does patient want to make changes to medical advance directive?  No - Patient declined   Copy of Healthcare Power of Attorney in Chart? No - copy requested No - copy requested     Current Medications (verified) Outpatient Encounter Medications as of 10/11/2022  Medication Sig   BIOTIN 5000 PO Take 1 tablet by mouth daily.   clonazePAM (KLONOPIN) 0.5 MG tablet Take 0.5 mg by mouth at bedtime as needed.   fexofenadine (ALLEGRA) 180 MG tablet Take 1 tablet by mouth daily.   fluticasone (FLONASE) 50 MCG/ACT nasal spray Place 1 spray into the nose daily.   glucosamine-chondroitin 500-400 MG tablet Take 1 tablet by mouth 3 (three) times daily.   NON FORMULARY Place 1 drop into the left eye in the morning and at bedtime. Renger-Eyes   Omega-3 Fatty Acids (OMEGA 3 PO) Take by mouth.   PRESCRIPTION MEDICATION  Regenereyes 1drop left eye twice daily   rosuvastatin (CRESTOR) 5 MG tablet Take 5 mg by mouth daily.   scopolamine (TRANSDERM-SCOP, 1.5 MG,) 1 MG/3DAYS Place 1 patch (1.5 mg total) onto the skin every 3 (three) days.   sertraline (ZOLOFT) 50 MG tablet sertraline 50 mg tablet  TAKE 1 TABLET BY MOUTH EVERY DAY FOR 30 DAYS   No facility-administered encounter medications on file as of 10/11/2022.    Allergies (verified) Patient has no known allergies.   History: Past Medical History:  Diagnosis Date   Allergy    Past Surgical History:  Procedure Laterality Date   BREAST BIOPSY Right    removal benign adenoma right breast   BTL     COL[facility]  [facility]rs [facility]    [facility]"   Family History  Problem Relation Age of Onset   Heart disease Mother    Heart disease Father    Cancer Maternal Grandmother        colon   Colon cancer Maternal Grandmother    Esophageal cancer Neg Hx    Stomach cancer Neg Hx    Breast cancer Neg Hx    Social History   Socioeconomic History   Marital status: Married    Spouse name: Not on file   Number of children: Not on file   Years of education: Not on file   Highest education level: Not on file  Occupational History   Not on  file  Tobacco Use   Smoking status: Never   Smokeless tobacco: Never  Vaping Use   Vaping Use: Never used  Substance and Sexual Activity   Alcohol use: Yes    Alcohol/week: 0.0 standard drinks of alcohol    Comment: social/maybe 2 times per month per pt   Drug use: No   Sexual activity: Not on file  Other Topics Concern   Not on file  Social History Narrative   Not on file   Social Determinants of Health   Financial Resource Strain: Low Risk  (10/11/2022)   Overall Financial Resource Strain (CARDIA)    Difficulty of Paying Living Expenses: Not hard at all  Food Insecurity: No Food Insecurity (10/11/2022)   Hunger Vital Sign    Worried About Running Out of Food in the Last Year: Never true    Ran Out of  Food in the Last Year: Never true  Transportation Needs: No Transportation Needs (10/11/2022)   PRAPARE - Administrator, Civil Service (Medical): No    Lack of Transportation (Non-Medical): No  Physical Activity: Sufficiently Active (10/11/2022)   Exercise Vital Sign    Days of Exercise per Week: 5 days    Minutes of Exercise per Session: 60 min  Stress: No Stress Concern Present (10/11/2022)   [employer] of Occupational Health - Occupational Stress Questionnaire    Feeling of Stress : Not at all  Social Connections: Unknown (10/11/2022)   Social Connection and Isolation Panel [NHANES]    Frequency of Communication with Friends and Family: More than three times a week    Frequency of Social Gatherings with Friends and Family: Once a week    Attends Religious Services: Patient declined    Database administrator or Organizations: Patient declined    Attends Engineer, structural: Patient declined    Marital Status: Married    Tobacco Counseling Counseling given: Not Answered   Clinical Intake:  Pre-visit preparation completed: Yes  Pain : No/denies pain Pain Score: 0-No pain     BMI - recorded: 23.17 Nutritional Status: BMI of 19-24  Normal Nutritional Risks: None Diabetes: No  How often do you need to have someone help you when you read instructions, pamphlets, or other written materials from your doctor or pharmacy?: 1 - Never What is the last grade level you completed in school?: Associate's Degree  Diabetic? No  Interpreter Needed?: No  Information entered by :: Susie Cassette, LPN.   Activities of Daily Living    10/11/2022   10:12 AM  In your present state of health, do you have any difficulty performing the following activities:  Hearing? 0  Vision? 0  Difficulty concentrating or making decisions? 0  Walking or climbing stairs? 0  Dressing or bathing? 0  Doing errands, shopping? 0  Preparing Food and eating ? N  Using the Toilet? N   In the past six months, have you accidently leaked urine? N  Do you have problems with loss of bowel control? N  Managing your Medications? N  Managing your Finances? N  Housekeeping or managing your Housekeeping? N    Patient Care Team: Myrlene Broker, MD as PCP - General (Internal Medicine) Gelene Mink, OD as Referring Physician (Optometry)  Indicate any recent Medical Services you may have received from other than [facility] providers in the past year (date may be approximate).     Assessment:   This is a routine wellness examination for [place].  Hearing/Vision  screen Hearing Screening - Comments:: Denies hearing difficulties   Vision Screening - Comments:: Wears rx glasses - up to date with routine eye exams with [facility]   Dietary issues and exercise activities discussed: Current Exercise Habits: Home exercise routine;Structured exercise class, Type of exercise: walking;treadmill;stretching;strength training/weights;exercise ball;calisthenics;Other - see comments (running, bike riding), Time (Minutes): 60, Frequency (Times/Week): 5, Weekly Exercise (Minutes/Week): 300, Intensity: Moderate, Exercise limited by: orthopedic condition(s)   Goals Addressed             This Visit's Progress    My goal for 2024 is to maintain my health and stay physically active.        Depression Screen    10/11/2022   10:37 AM 07/02/2021    1:09 PM 05/27/2020   10:15 AM  PHQ 2/9 Scores  PHQ - 2 Score 0 0 0  PHQ- 9 Score 0      Fall Risk    10/11/2022   10:09 AM 07/02/2021    1:07 PM 05/27/2020   10:15 AM  Fall Risk   Falls in the past year? 0 0 0  Number falls in past yr: 0 0   Injury with Fall? 0 0   Risk for fall due to : No Fall Risks No Fall Risks   Follow up Falls prevention discussed Falls evaluation completed     FALL RISK PREVENTION PERTAINING TO THE HOME:  Any stairs in or around the home? No  If so, are there any without handrails? No  Home  free of loose throw rugs in walkways, pet beds, electrical cords, etc? Yes  Adequate lighting in your home to reduce risk of falls? Yes   ASSISTIVE DEVICES UTILIZED TO PREVENT FALLS:  Life alert? No  Use of a cane, walker or w/c? No  Grab bars in the bathroom? Yes  Shower chair or bench in shower? Yes  Elevated toilet seat or a handicapped toilet? Yes   TIMED UP AND GO:  Was the test performed? No . Telephonic Visit  Cognitive Function:        10/11/2022   10:10 AM  6CIT Screen  What Year? 0 points  What month? 0 points  What time? 0 points  Count back from 20 0 points  Months in reverse 0 points  Repeat phrase 0 points  Total Score 0 points    Immunizations Immunization History  Administered Date(s) Administered   Fluad Quad(high Dose 65+) 03/25/2020, 03/24/2022   Influenza, High Dose Seasonal PF 03/19/2017, 03/22/2019   Influenza,inj,Quad PF,6+ Mos 03/21/2014   Influenza-Unspecified 02/11/2016, 02/18/2018, 03/22/2019, 03/25/2020, 03/18/2021   PFIZER(Purple Top)SARS-COV-2 Vaccination 06/26/2019, 07/17/2019, 03/04/2020, 10/23/2020   PPD Test 05/27/2020, 07/30/2021   Pfizer Covid-19 Vaccine Bivalent Booster 63yrs & up 03/18/2021   Pneumococcal Conjugate-13 01/27/2018   Pneumococcal Polysaccharide-23 02/21/2019   Pneumococcal-Unspecified 02/21/2019   Tdap 03/22/2016   Zoster Recombinat (Shingrix) 01/27/2018, 04/27/2018   Zoster, Live 10/31/2014, 01/27/2018    TDAP status: Up to date  Flu Vaccine status: Up to date  Pneumococcal vaccine status: Up to date  Covid-19 vaccine status: Completed vaccines  Qualifies for Shingles Vaccine? Yes   Zostavax completed Yes   Shingrix Completed?: Yes  Screening Tests Health Maintenance  Topic Date Due   DEXA SCAN  Never done   COVID-19 Vaccine (6 - 2023-24 season) 02/05/2022   INFLUENZA VACCINE  01/06/2023   Medicare Annual Wellness (AWV)  10/11/2023   MAMMOGRAM  05/18/2024   DTaP/Tdap/Td (2 - Td or Tdap) 03/22/2026  COLONOSCOPY (Pts 45-78yrs Insurance coverage will need to be confirmed)  12/03/2027   Pneumonia Vaccine 72+ Years old  Completed   Hepatitis C Screening  Completed   Zoster Vaccines- Shingrix  Completed   HPV VACCINES  Aged Out    Health Maintenance  Health Maintenance Due  Topic Date Due   DEXA SCAN  Never done   COVID-19 Vaccine (6 - 2023-24 season) 02/05/2022    Colorectal cancer screening: Type of screening: Colonoscopy. Completed 12/02/2017. Repeat every 10 years  Mammogram status: Completed 05/18/2022. Repeat every year  Bone Density status: Completed 02/18/2020. Results reflect: Bone density results: OSTEOPENIA. Repeat every 3 years.  Lung Cancer Screening: (Low Dose CT Chest recommended if Age 71-80 years, 30 pack-year currently smoking OR have quit w/in 15years.) does not qualify.   Lung Cancer Screening Referral: No  Additional Screening:  Hepatitis C Screening: does qualify; Completed 03/22/2016  Vision Screening: Recommended annual ophthalmology exams for early detection of glaucoma and other disorders of the eye. Is the patient up to date with their annual eye exam?  Yes  Who is the provider or what is the name of the office in which the patient attends annual eye exams? Gelene Mink, OD. If pt is not established with a provider, would they like to be referred to a provider to establish care? No .   Dental Screening: Recommended annual dental exams for proper oral hygiene  Community Resource Referral / Chronic Care Management: CRR required this visit?  No   CCM required this visit?  No      Plan:     I have personally reviewed and noted the following in the patient's chart:   Medical and social history Use of alcohol, tobacco or illicit drugs  Current medications and supplements including opioid prescriptions. Patient is not currently taking opioid prescriptions. Functional ability and status Nutritional status Physical activity Advanced  directives List of other physicians Hospitalizations, surgeries, and ER visits in previous 12 months Vitals Screenings to include cognitive, depression, and falls Referrals and appointments  In addition, I have reviewed and discussed with patient certain preventive protocols, quality metrics, and best practice recommendations. A written personalized care plan for preventive services as well as general preventive health recommendations were provided to patient.     Mickeal Needy, LPN   4/0/9811   Nurse Notes:  Normal cognitive status assessed by direct observation via by this Nurse Health Advisor. No abnormalities found.

## 2022-10-26 ENCOUNTER — Encounter: Payer: Self-pay | Admitting: Internal Medicine

## 2022-10-26 ENCOUNTER — Ambulatory Visit (INDEPENDENT_AMBULATORY_CARE_PROVIDER_SITE_OTHER): Payer: Medicare PPO | Admitting: Internal Medicine

## 2022-10-26 VITALS — BP 142/98 | HR 70 | Temp 97.9°F | Ht 64.0 in | Wt 142.0 lb

## 2022-10-26 DIAGNOSIS — E782 Mixed hyperlipidemia: Secondary | ICD-10-CM

## 2022-10-26 DIAGNOSIS — E785 Hyperlipidemia, unspecified: Secondary | ICD-10-CM | POA: Insufficient documentation

## 2022-10-26 DIAGNOSIS — Z0000 Encounter for general adult medical examination without abnormal findings: Secondary | ICD-10-CM

## 2022-10-26 NOTE — Assessment & Plan Note (Signed)
Flu shot yearly. Pneumonia complete. Shingrix complete. Tetanus up to date. Colonoscopy up to date. Mammogram up to date, pap smear complete and dexa complete. Counseled about sun safety and mole surveillance. Counseled about the dangers of distracted driving. Given 10 year screening recommendations.

## 2022-10-26 NOTE — Progress Notes (Signed)
   Subjective:   Patient ID: Kristi Alvarado, female    DOB: Oct 22, 1950, 72 y.o.   MRN: [id number]  HPI The patient is here for physical.  PMH, [facility], social history reviewed and updated  Review of Systems  Constitutional: Negative.   HENT: Negative.    Eyes: Negative.   Respiratory:  Negative for cough, chest tightness and shortness of breath.   Cardiovascular:  Negative for chest pain, palpitations and leg swelling.  Gastrointestinal:  Negative for abdominal distention, abdominal pain, constipation, diarrhea, nausea and vomiting.  Musculoskeletal: Negative.   Skin: Negative.   Neurological: Negative.   Psychiatric/Behavioral: Negative.      Objective:  Physical Exam Constitutional:      Appearance: She is well-developed.  HENT:     Head: Normocephalic and atraumatic.  Cardiovascular:     Rate and Rhythm: Normal rate and regular rhythm.  Pulmonary:     Effort: Pulmonary effort is normal. No respiratory distress.     Breath sounds: Normal breath sounds. No wheezing or rales.  Abdominal:     General: Bowel sounds are normal. There is no distension.     Palpations: Abdomen is soft.     Tenderness: There is no abdominal tenderness. There is no rebound.  Musculoskeletal:     Cervical back: Normal range of motion.  Skin:    General: Skin is warm and dry.  Neurological:     Mental Status: She is alert and oriented to person, place, and time.     Coordination: Coordination normal.     Vitals:   10/26/22 1056 10/26/22 1058  BP: (!) 142/98 (!) 142/98  Pulse: 70   Temp: 97.9 F (36.6 C)   TempSrc: Oral   SpO2: 98%   Weight: 142 lb (64.4 kg)   Height: 5\' 4"  (1.626 m)     Assessment & Plan:

## 2022-10-26 NOTE — Assessment & Plan Note (Addendum)
Was tried on crestor 5 mg with severe fatigue and now off recent lipid panel with LDL 125 and this is acceptable without medication. We have ordered calcium score to assess risk.

## 2022-11-30 ENCOUNTER — Ambulatory Visit (HOSPITAL_COMMUNITY)
Admission: RE | Admit: 2022-11-30 | Discharge: 2022-11-30 | Disposition: A | Discharge status: Home / Self Care | Payer: Medicare PPO | Source: Ambulatory Visit | Attending: Internal Medicine | Admitting: Internal Medicine

## 2022-11-30 ENCOUNTER — Encounter: Payer: Self-pay | Admitting: Internal Medicine

## 2022-11-30 DIAGNOSIS — E782 Mixed hyperlipidemia: Secondary | ICD-10-CM | POA: Insufficient documentation

## 2022-11-30 DIAGNOSIS — R931 Abnormal findings on diagnostic imaging of heart and coronary circulation: Secondary | ICD-10-CM

## 2022-12-01 MED ORDER — PRAVASTATIN SODIUM 10 MG PO TABS
10.0000 mg | ORAL_TABLET | Freq: Every day | ORAL | 3 refills | Status: DC
Start: 2022-12-01 — End: 2023-02-10

## 2023-02-03 ENCOUNTER — Ambulatory Visit: Payer: Medicare PPO | Attending: Cardiology | Admitting: Cardiology

## 2023-02-03 VITALS — BP 158/70 | HR 85 | Ht 64.0 in | Wt 143.8 lb

## 2023-02-03 DIAGNOSIS — E782 Mixed hyperlipidemia: Secondary | ICD-10-CM | POA: Diagnosis not present

## 2023-02-03 DIAGNOSIS — I10 Essential (primary) hypertension: Secondary | ICD-10-CM | POA: Diagnosis not present

## 2023-02-03 DIAGNOSIS — R931 Abnormal findings on diagnostic imaging of heart and coronary circulation: Secondary | ICD-10-CM | POA: Diagnosis not present

## 2023-02-03 MED ORDER — AMLODIPINE BESYLATE 2.5 MG PO TABS: 2.5000 mg | ORAL_TABLET | Freq: Every day | ORAL | 2 refills | Status: DC

## 2023-02-03 NOTE — Progress Notes (Signed)
Cardiology Office Note:    Date:  02/03/2023   ID:  Kristi Alvarado, DOB [birth year], MRN [id number]  PCP:  Myrlene Broker, MD  Cardiologist:  Thomasene Ripple, DO  Electrophysiologist:  None   Referring MD: Myrlene Broker, *   " I am ok"  History of Present Illness:    Kristi Alvarado is a 72 y.o. female with a hx of elevated coronary calcium, Activon score is 333, hypertension, hyperlipidemia was asked to see cardiology due to the elevated coronary calcium score.  She offers no complaints at this time.  She denies any chest pain or shortness of breath.   Past Medical History:  Diagnosis Date   Allergy     Past Surgical History:  Procedure Laterality Date   BREAST BIOPSY Right    removal benign adenoma right breast   BTL     [place]  [place] [facility]    Current Medications: Current Meds  Medication Sig   amLODipine (NORVASC) 2.5 MG tablet Take 1 tablet (2.5 mg total) by mouth daily.   BIOTIN 5000 PO Take 1 tablet by mouth daily.   fexofenadine (ALLEGRA) 180 MG tablet Take 1 tablet by mouth daily.   fluticasone (FLONASE) 50 MCG/ACT nasal spray Place 1 spray into the nose daily.   glucosamine-chondroitin 500-400 MG tablet Take 1 tablet by mouth 3 (three) times daily.   NON FORMULARY Place 1 drop into the left eye in the morning and at bedtime. Renger-Eyes   NON FORMULARY Take 1-3 capsules by mouth 2 (two) times daily. Patient takes 2 capsules in the morning and 1 at night   pravastatin (PRAVACHOL) 10 MG tablet Take 1 tablet (10 mg total) by mouth daily.   sertraline (ZOLOFT) 50 MG tablet sertraline 50 mg tablet  TAKE 1 TABLET BY MOUTH EVERY DAY FOR 30 DAYS     Allergies:   Patient has no known allergies.   Social History   Socioeconomic History   Marital status: Married    Spouse name: Not on file   Number of children: Not on file   Years of education: Not on file   Highest education level: Not on file  Occupational History    Not on file  Tobacco Use   Smoking status: Never   Smokeless tobacco: Never  Vaping Use   Vaping status: Never Used  Substance and Sexual Activity   Alcohol use: Yes    Alcohol/week: 0.0 standard drinks of alcohol    Comment: social/maybe 2 times per month per pt   Drug use: No   Sexual activity: Not on file  Other Topics Concern   Not on file  Social History Narrative   Not on file   Social Determinants of Health   Financial Resource Strain: Low Risk  (10/11/2022)   Overall Financial Resource Strain (CARDIA)    Difficulty of Paying Living Expenses: Not hard at all  Food Insecurity: No Food Insecurity (10/11/2022)   Hunger Vital Sign    Worried About Running Out of Food in the Last Year: Never true    Ran Out of Food in the Last Year: Never true  Transportation Needs: No Transportation Needs (10/11/2022)   PRAPARE - Administrator, Civil Service (Medical): No    Lack of Transportation (Non-Medical): No  Physical Activity: Sufficiently Active (10/11/2022)   Exercise Vital Sign    Days of Exercise per Week: 5 days    Minutes of Exercise  per Session: 60 min  Stress: No Stress Concern Present (10/11/2022)   [employer] of Occupational Health - Occupational Stress Questionnaire    Feeling of Stress : Not at all  Social Connections: Unknown (10/11/2022)   Social Connection and Isolation Panel [NHANES]    Frequency of Communication with Friends and Family: More than three times a week    Frequency of Social Gatherings with Friends and Family: Once a week    Attends Religious Services: Patient declined    Database administrator or Organizations: Patient declined    Attends Engineer, structural: Patient declined    Marital Status: Married     Family History: The patient's family history includes Cancer in her maternal grandmother; Colon cancer in her maternal grandmother; Heart disease in her father and mother. There is no history of Esophageal cancer, Stomach  cancer, or Breast cancer.  ROS:   Review of Systems  Constitution: Negative for decreased appetite, fever and weight gain.  HENT: Negative for congestion, ear discharge, hoarse voice and sore throat.   Eyes: Negative for discharge, redness, vision loss in right eye and visual halos.  Cardiovascular: Negative for chest pain, dyspnea on exertion, leg swelling, orthopnea and palpitations.  Respiratory: Negative for cough, hemoptysis, shortness of breath and snoring.   Endocrine: Negative for heat intolerance and polyphagia.  Hematologic/Lymphatic: Negative for bleeding problem. Does not bruise/bleed easily.  Skin: Negative for flushing, nail changes, rash and suspicious lesions.  Musculoskeletal: Negative for arthritis, joint pain, muscle cramps, myalgias, neck pain and stiffness.  Gastrointestinal: Negative for abdominal pain, bowel incontinence, diarrhea and excessive appetite.  Genitourinary: Negative for decreased libido, genital sores and incomplete emptying.  Neurological: Negative for brief paralysis, focal weakness, headaches and loss of balance.  Psychiatric/Behavioral: Negative for altered mental status, depression and suicidal ideas.  Allergic/Immunologic: Negative for HIV exposure and persistent infections.    EKGs/Labs/Other Studies Reviewed:    The following studies were reviewed today:   EKG:  The ekg ordered today demonstrates sinus rhythm  Recent Labs: No results found for requested labs within last 365 days.  Recent Lipid Panel    Component Value Date/Time   CHOL 224 (A) 02/19/2020 0000   TRIG 54 02/19/2020 0000   HDL 76 (A) 02/19/2020 0000   CHOLHDL 3 03/22/2016 1532   VLDL 26.4 03/22/2016 1532   LDLCALC 139 02/19/2020 0000    Physical Exam:    VS:  BP (!) 158/70 (BP Location: Left Arm, Patient Position: Sitting, Cuff Size: Normal)   Pulse 85   Ht 5\' 4"  (1.626 m)   Wt 143 lb 12.8 oz (65.2 kg)   SpO2 94%   BMI 24.68 kg/m     Wt Readings from Last 3  Encounters:  02/03/23 143 lb 12.8 oz (65.2 kg)  10/26/22 142 lb (64.4 kg)  10/11/22 135 lb (61.2 kg)     GEN: Well nourished, well developed in no acute distress HEENT: Normal NECK: No JVD; No carotid bruits LYMPHATICS: No lymphadenopathy CARDIAC: S1S2 noted,RRR, no murmurs, rubs, gallops RESPIRATORY:  Clear to auscultation without rales, wheezing or rhonchi  ABDOMEN: Soft, non-tender, non-distended, +bowel sounds, no guarding. EXTREMITIES: No edema, No cyanosis, no clubbing MUSCULOSKELETAL:  No deformity  SKIN: Warm and dry NEUROLOGIC:  Alert and oriented x 3, non-focal PSYCHIATRIC:  Normal affect, good insight  ASSESSMENT:    1. High coronary artery calcium score   2. Mixed hyperlipidemia   3. Primary hypertension    PLAN:  1.  She is hypertensive in the office today.  I reviewed her previous office visits in epic which show blood pressure that was also elevated.  She would benefit from low-dose antihypertensive medication amlodipine 2.5 mg has been started.  2.  She has been started on pravastatin by her PCP 10 mg a day.  She was on Crestor prior and had significant fatigue and muscle cramp.  Will continue the pravastatin for now we will repeat lipid profile she will come back tomorrow to get this blood work done.  She does be elevated will consider combination therapy or transitioning to Lipitor.  The patient is in agreement with the above plan. The patient left the office in stable condition.  The patient will follow up in   Medication Adjustments/Labs and Tests Ordered: Current medicines are reviewed at length with the patient today.  Concerns regarding medicines are outlined above.  Orders Placed This Encounter  Procedures   Lipid panel   EKG 12-Lead   Meds ordered this encounter  Medications   amLODipine (NORVASC) 2.5 MG tablet    Sig: Take 1 tablet (2.5 mg total) by mouth daily.    Dispense:  90 tablet    Refill:  2    Patient Instructions  The Salty  Six:       Adopting a Healthy Lifestyle.  Know what a healthy weight is for you (roughly BMI <25) and aim to maintain this   Aim for 7+ servings of fruits and vegetables daily   65-80+ fluid ounces of water or unsweet tea for healthy kidneys   Limit to max 1 drink of alcohol per day; avoid smoking/tobacco   Limit animal fats in diet for cholesterol and heart health - choose grass fed whenever available   Avoid highly processed foods, and foods high in saturated/trans fats   Aim for low stress - take time to unwind and care for your mental health   Aim for 150 min of moderate intensity exercise weekly for heart health, and weights twice weekly for bone health   Aim for 7-9 hours of sleep daily   When it comes to diets, agreement about the perfect plan isnt easy to find, even among the experts. Experts at the [facility] [employer] developed an idea known as the Healthy Eating Plate. Just imagine a plate divided into logical, healthy portions.   The emphasis is on diet quality:   Load up on vegetables and fruits - one-half of your plate: Aim for color and variety, and remember that potatoes dont count.   Go for whole grains - one-quarter of your plate: Whole wheat, barley, wheat berries, quinoa, oats, brown rice, and foods made with them. If you want pasta, go with whole wheat pasta.   Protein power - one-quarter of your plate: Fish, chicken, beans, and nuts are all healthy, versatile protein sources. Limit red meat.   The diet, however, does go beyond the plate, offering a few other suggestions.   Use healthy plant oils, such as olive, canola, soy, corn, sunflower and peanut. Check the labels, and avoid partially hydrogenated oil, which have unhealthy trans fats.   If youre thirsty, drink water. Coffee and tea are good in moderation, but skip sugary drinks and limit milk and dairy products to one or two daily servings.   The type of carbohydrate in the diet is  more important than the amount. Some sources of carbohydrates, such as vegetables, fruits, whole grains, and beans-are healthier than others.  Finally, stay active  Signed, Thomasene Ripple, DO  02/03/2023 3:22 PM    [facility]

## 2023-02-03 NOTE — Patient Instructions (Addendum)
  The Salty Six:

## 2023-02-04 DIAGNOSIS — E782 Mixed hyperlipidemia: Secondary | ICD-10-CM | POA: Diagnosis not present

## 2023-02-04 DIAGNOSIS — H2511 Age-related nuclear cataract, right eye: Secondary | ICD-10-CM | POA: Diagnosis not present

## 2023-02-04 DIAGNOSIS — H16223 Keratoconjunctivitis sicca, not specified as Sjogren's, bilateral: Secondary | ICD-10-CM | POA: Diagnosis not present

## 2023-02-04 DIAGNOSIS — R931 Abnormal findings on diagnostic imaging of heart and coronary circulation: Secondary | ICD-10-CM | POA: Diagnosis not present

## 2023-02-04 DIAGNOSIS — H25042 Posterior subcapsular polar age-related cataract, left eye: Secondary | ICD-10-CM | POA: Diagnosis not present

## 2023-02-05 LAB — LIPID PANEL
Chol/HDL Ratio: 3.1 ratio (ref 0.0–4.4)
Cholesterol, Total: 194 mg/dL (ref 100–199)
HDL: 62 mg/dL (ref >39)
LDL Chol Calc (NIH): 117 mg/dL — ABNORMAL HIGH (ref 0–99)
Triglycerides: 83 mg/dL (ref 0–149)
VLDL Cholesterol Cal: 15 mg/dL (ref 5–40)

## 2023-02-09 DIAGNOSIS — L821 Other seborrheic keratosis: Secondary | ICD-10-CM | POA: Diagnosis not present

## 2023-02-09 DIAGNOSIS — L820 Inflamed seborrheic keratosis: Secondary | ICD-10-CM | POA: Diagnosis not present

## 2023-02-09 DIAGNOSIS — D2272 Melanocytic nevi of left lower limb, including hip: Secondary | ICD-10-CM | POA: Diagnosis not present

## 2023-02-09 DIAGNOSIS — Z808 Family history of malignant neoplasm of other organs or systems: Secondary | ICD-10-CM | POA: Diagnosis not present

## 2023-02-09 DIAGNOSIS — L814 Other melanin hyperpigmentation: Secondary | ICD-10-CM | POA: Diagnosis not present

## 2023-02-09 DIAGNOSIS — D225 Melanocytic nevi of trunk: Secondary | ICD-10-CM | POA: Diagnosis not present

## 2023-02-09 DIAGNOSIS — L578 Other skin changes due to chronic exposure to nonionizing radiation: Secondary | ICD-10-CM | POA: Diagnosis not present

## 2023-02-09 DIAGNOSIS — Z85828 Personal history of other malignant neoplasm of skin: Secondary | ICD-10-CM | POA: Diagnosis not present

## 2023-02-09 DIAGNOSIS — Z85820 Personal history of malignant melanoma of skin: Secondary | ICD-10-CM | POA: Diagnosis not present

## 2023-02-09 DIAGNOSIS — D485 Neoplasm of uncertain behavior of skin: Secondary | ICD-10-CM | POA: Diagnosis not present

## 2023-02-09 DIAGNOSIS — D2261 Melanocytic nevi of right upper limb, including shoulder: Secondary | ICD-10-CM | POA: Diagnosis not present

## 2023-02-10 ENCOUNTER — Other Ambulatory Visit: Payer: Self-pay

## 2023-02-10 ENCOUNTER — Encounter: Payer: Self-pay | Admitting: Cardiology

## 2023-02-10 MED ORDER — ATORVASTATIN CALCIUM 10 MG PO TABS: 10.0000 mg | ORAL_TABLET | Freq: Every day | ORAL | 3 refills | Status: DC

## 2023-02-23 DIAGNOSIS — N958 Other specified menopausal and perimenopausal disorders: Secondary | ICD-10-CM | POA: Diagnosis not present

## 2023-02-23 DIAGNOSIS — M8588 Other specified disorders of bone density and structure, other site: Secondary | ICD-10-CM | POA: Diagnosis not present

## 2023-04-18 ENCOUNTER — Encounter: Payer: Self-pay | Admitting: Family Medicine

## 2023-04-18 ENCOUNTER — Ambulatory Visit: Payer: Medicare PPO | Admitting: Family Medicine

## 2023-04-18 VITALS — BP 160/92 | HR 70 | Temp 97.8°F | Resp 20 | Ht 64.0 in | Wt 140.0 lb

## 2023-04-18 DIAGNOSIS — I10 Essential (primary) hypertension: Secondary | ICD-10-CM | POA: Diagnosis not present

## 2023-04-18 DIAGNOSIS — M5441 Lumbago with sciatica, right side: Secondary | ICD-10-CM | POA: Diagnosis not present

## 2023-04-18 MED ORDER — KETOROLAC TROMETHAMINE 60 MG/2ML IM SOLN
30.0000 mg | Freq: Once | INTRAMUSCULAR | Status: AC
  Administered 2023-04-18: 30 mg via INTRAMUSCULAR

## 2023-04-18 MED ORDER — KETOROLAC TROMETHAMINE 60 MG/2ML IM SOLN: 30.0000 mg | Freq: Once | INTRAMUSCULAR | Status: DC

## 2023-04-18 MED ORDER — PREDNISONE 10 MG (21) PO TBPK: ORAL_TABLET | ORAL | 0 refills | Status: DC

## 2023-04-18 NOTE — Progress Notes (Signed)
Assessment & Plan:  1. Acute right-sided low back pain with right-sided sciatica Sciatica exercises provided for patient to complete at home.  - ketorolac (TORADOL) injection 30 mg - predniSONE (STERAPRED UNI-PAK 21 TAB) 10 MG (21) TBPK tablet; As directed x 6 days  Dispense: 21 tablet; Refill: 0  2. Primary hypertension Encouraged to monitor BP at home and keep a log. Discussed goal BP <150/90. Advised to take log with her to her cardiology appointment next month.    Follow up plan: Return for as scheduled with PCP.  Deliah Boston, MSN, APRN, FNP-C  Subjective:  HPI: Kristi Alvarado is a 72 y.o. female presenting on 04/18/2023 for Back Pain (Right side Right lower back pain and hip pain /Radiates down right leg - leg tingles (x 1 week - today is the worst) /X 2 weeks total )  Patient reports right sided low back and hip pain that started two weeks ago. She is experiencing radiation down her right leg that started one week ago and has progressively worsened. 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 She has been taking Aleve, Tylenol, and applying Voltaren gel without relief.     ROS: Negative unless specifically indicated above in HPI.   Relevant past medical history reviewed and updated as indicated.   Allergies and medications reviewed and updated.   Current Outpatient Medications:    amLODipine (NORVASC) 2.5 MG tablet, Take 1 tablet (2.5 mg total) by mouth daily., Disp: 90 tablet, Rfl: 2   atorvastatin (LIPITOR) 10 MG tablet, Take 1 tablet (10 mg total) by mouth daily., Disp: 90 tablet, Rfl: 3   BIOTIN 5000 PO, Take 1 tablet by mouth daily., Disp: , Rfl:    clonazePAM (KLONOPIN) 0.5 MG tablet, Take 0.5 mg by mouth at bedtime as needed., Disp: , Rfl:    fexofenadine (ALLEGRA) 180 MG tablet, Take 1 tablet by mouth daily., Disp: , Rfl:    fluticasone (FLONASE) 50 MCG/ACT nasal spray, Place 1  spray into the nose daily., Disp: , Rfl:    glucosamine-chondroitin 500-400 MG tablet, Take 1 tablet by mouth 3 (three) times daily., Disp: , Rfl:    NON FORMULARY, Take 1-3 capsules by mouth 2 (two) times daily. Patient takes 2 capsules in the morning and 1 at night, Disp: , Rfl:    PRESCRIPTION MEDICATION, Regenereyes 1drop left eye twice daily, Disp: , Rfl:    sertraline (ZOLOFT) 50 MG tablet, sertraline 50 mg tablet  TAKE 1 TABLET BY MOUTH EVERY DAY FOR 30 DAYS, Disp: , Rfl:   No Known Allergies  Objective:   BP (!) 160/92   Pulse 70   Temp 97.8 F (36.6 C)   Resp 20   Ht 5\' 4"  (1.626 m)   Wt 140 lb (63.5 kg)   SpO2 99%   BMI 24.03 kg/m    Physical Exam Vitals reviewed.  Constitutional:      General: She is not in acute distress.    Appearance: Normal appearance. She is not ill-appearing, toxic-appearing or diaphoretic.  HENT:     Head: Normocephalic and atraumatic.  Eyes:     General: No scleral icterus.       Right eye: No discharge.        Left eye: No discharge.     Conjunctiva/sclera: Conjunctivae normal.  Cardiovascular:     Rate and Rhythm: Normal rate.  Pulmonary:     Effort: Pulmonary  effort is normal. No respiratory distress.  Musculoskeletal:     Cervical back: Normal range of motion.     Lumbar back: Tenderness (right side muscular) present. No bony tenderness. Positive right straight leg raise test. Negative left straight leg raise test.     Right hip: Tenderness (with external rotation) present.  Skin:    General: Skin is warm and dry.     Capillary Refill: Capillary refill takes less than 2 seconds.  Neurological:     General: No focal deficit present.     Mental Status: She is alert and oriented to person, place, and time. Mental status is at baseline.  Psychiatric:        Mood and Affect: Mood normal.        Behavior: Behavior normal.        Thought Content: Thought content normal.        Judgment: Judgment normal.

## 2023-04-18 NOTE — Patient Instructions (Addendum)
Monitor your blood pressure and keep a log. Goal <150/90. Keep scheduled appointment with cardiology next month and bring your log to them for adjustment if needed.

## 2023-04-18 NOTE — Addendum Note (Signed)
Addended by: Magdalene River on: 04/18/2023 12:58 PM   Modules accepted: Orders

## 2023-04-22 ENCOUNTER — Telehealth: Payer: Self-pay | Admitting: Internal Medicine

## 2023-04-22 ENCOUNTER — Ambulatory Visit (INDEPENDENT_AMBULATORY_CARE_PROVIDER_SITE_OTHER): Payer: Medicare PPO

## 2023-04-22 ENCOUNTER — Encounter: Payer: Self-pay | Admitting: Family Medicine

## 2023-04-22 ENCOUNTER — Ambulatory Visit: Payer: Medicare PPO | Admitting: Family Medicine

## 2023-04-22 VITALS — BP 144/80 | HR 75 | Temp 98.4°F | Ht 64.0 in | Wt 140.0 lb

## 2023-04-22 DIAGNOSIS — M48061 Spinal stenosis, lumbar region without neurogenic claudication: Secondary | ICD-10-CM | POA: Diagnosis not present

## 2023-04-22 DIAGNOSIS — M25551 Pain in right hip: Secondary | ICD-10-CM

## 2023-04-22 DIAGNOSIS — M545 Low back pain, unspecified: Secondary | ICD-10-CM | POA: Diagnosis not present

## 2023-04-22 DIAGNOSIS — M51362 Other intervertebral disc degeneration, lumbar region with discogenic back pain and lower extremity pain: Secondary | ICD-10-CM | POA: Diagnosis not present

## 2023-04-22 DIAGNOSIS — M4316 Spondylolisthesis, lumbar region: Secondary | ICD-10-CM | POA: Diagnosis not present

## 2023-04-22 DIAGNOSIS — M1611 Unilateral primary osteoarthritis, right hip: Secondary | ICD-10-CM | POA: Diagnosis not present

## 2023-04-22 DIAGNOSIS — M5441 Lumbago with sciatica, right side: Secondary | ICD-10-CM

## 2023-04-22 DIAGNOSIS — M438X6 Other specified deforming dorsopathies, lumbar region: Secondary | ICD-10-CM | POA: Diagnosis not present

## 2023-04-22 MED ORDER — KETOROLAC TROMETHAMINE 60 MG/2ML IM SOLN
60.0000 mg | Freq: Once | INTRAMUSCULAR | Status: AC
  Administered 2023-04-22: 60 mg via INTRAMUSCULAR

## 2023-04-22 MED ORDER — METHOCARBAMOL 500 MG PO TABS: 500.0000 mg | ORAL_TABLET | Freq: Three times a day (TID) | ORAL | 0 refills | Status: AC | PRN

## 2023-04-22 NOTE — Patient Instructions (Signed)
Please go downstairs for X rays of your low back and right hip.   Take Tylenol and the muscle relaxant Robaxin for today but no Aleve until tomorrow.   Use a heating pad and topical Voltaren gel.   If your pain gets much worse over the weekend, you may have to go to urgent care.   Please schedule with Dr. Denyse Amass

## 2023-04-22 NOTE — Progress Notes (Unsigned)
Subjective:     Patient ID: Kristi Alvarado, female    DOB: 01-14-1951, 72 y.o.   MRN: [id number]  Chief Complaint  Patient presents with   Sciatica    Right back and lower hip. Pt states on her last prednisone pak.    HPI   History of Present Illness         She is here with severe right low back and right hip pain.  She saw another provider last week and completed a course of oral steroids today without much relief. States she feels like her buttock is cramping.  Occasional right sciatica.   No numbness. Some mild tingling intermittent and sensation of "giving away".   There are no preventive care reminders to display for this patient.  Past Medical History:  Diagnosis Date   Allergy    Hypertension     Past Surgical History:  Procedure Laterality Date   BREAST BIOPSY Right    removal benign adenoma right breast   BTL     [place]  [place]    Family History  Problem Relation Age of Onset   Heart disease Mother    Heart disease Father    Cancer Maternal Grandmother        colon   Colon cancer Maternal Grandmother    Esophageal cancer Neg Hx    Stomach cancer Neg Hx    Breast cancer Neg Hx     Social History   Socioeconomic History   Marital status: Married    Spouse name: Not on file   Number of children: Not on file   Years of education: Not on file   Highest education level: Not on file  Occupational History   Not on file  Tobacco Use   Smoking status: Never   Smokeless tobacco: Never  Vaping Use   Vaping status: Never Used  Substance and Sexual Activity   Alcohol use: Yes    Alcohol/week: 0.0 standard drinks of alcohol    Comment: social/maybe 2 times per month per pt   Drug use: No   Sexual activity: Not on file  Other Topics Concern   Not on file  Social History Narrative   Not on file   Social Determinants of Health   Financial Resource Strain: Low Risk  (10/11/2022)   Overall Financial Resource  Strain (CARDIA)    Difficulty of Paying Living Expenses: Not hard at all  Food Insecurity: No Food Insecurity (10/11/2022)   Hunger Vital Sign    Worried About Running Out of Food in the Last Year: Never true    Ran Out of Food in the Last Year: Never true  Transportation Needs: No Transportation Needs (10/11/2022)   PRAPARE - Administrator, Civil Service (Medical): No    Lack of Transportation (Non-Medical): No  Physical Activity: Sufficiently Active (10/11/2022)   Exercise Vital Sign    Days of Exercise per Week: 5 days    Minutes of Exercise per Session: 60 min  Stress: No Stress Concern Present (10/11/2022)   [employer] of Occupational Health - Occupational Stress Questionnaire    Feeling of Stress : Not at all  Social Connections: Unknown (10/11/2022)   Social Connection and Isolation Panel [NHANES]    Frequency of Communication with Friends and Family: More than three times a week    Frequency of Social Gatherings with Friends and Family: Once a week    Attends Religious  Services: Patient declined    Active Member of Clubs or Organizations: Patient declined    Attends Banker Meetings: Patient declined    Marital Status: Married  Catering manager Violence: Not At Risk (10/11/2022)   Humiliation, Afraid, Rape, and Kick questionnaire    Fear of Current or Ex-Partner: No    Emotionally Abused: No    Physically Abused: No    Sexually Abused: No    Outpatient Medications Prior to Visit  Medication Sig Dispense Refill   amLODipine (NORVASC) 2.5 MG tablet Take 1 tablet (2.5 mg total) by mouth daily. 90 tablet 2   atorvastatin (LIPITOR) 10 MG tablet Take 1 tablet (10 mg total) by mouth daily. 90 tablet 3   BIOTIN 5000 PO Take 1 tablet by mouth daily.     clonazePAM (KLONOPIN) 0.5 MG tablet Take 0.5 mg by mouth at bedtime as needed.     fexofenadine (ALLEGRA) 180 MG tablet Take 1 tablet by mouth daily.     fluticasone (FLONASE) 50 MCG/ACT nasal spray Place 1  spray into the nose daily.     glucosamine-chondroitin 500-400 MG tablet Take 1 tablet by mouth 3 (three) times daily.     NON FORMULARY Take 1-3 capsules by mouth 2 (two) times daily. Patient takes 2 capsules in the morning and 1 at night     predniSONE (STERAPRED UNI-PAK 21 TAB) 10 MG (21) TBPK tablet As directed x 6 days 21 tablet 0   PRESCRIPTION MEDICATION Regenereyes 1drop left eye twice daily     sertraline (ZOLOFT) 50 MG tablet sertraline 50 mg tablet  TAKE 1 TABLET BY MOUTH EVERY DAY FOR 30 DAYS     No facility-administered medications prior to visit.    No Known Allergies  Review of Systems  Constitutional:  Negative for chills and fever.  Respiratory:  Negative for shortness of breath.   Cardiovascular:  Negative for chest pain, palpitations and leg swelling.  Gastrointestinal:  Negative for abdominal pain, constipation, diarrhea, nausea and vomiting.  Genitourinary:  Negative for dysuria, frequency and urgency.  Musculoskeletal:  Positive for back pain, falls and joint pain.  Skin:  Negative for rash.  Neurological:  Positive for tingling. Negative for dizziness and focal weakness.       Objective:    Physical Exam Constitutional:      General: She is not in acute distress.    Appearance: She is not ill-appearing.  Eyes:     Extraocular Movements: Extraocular movements intact.     Conjunctiva/sclera: Conjunctivae normal.  Cardiovascular:     Rate and Rhythm: Normal rate.  Pulmonary:     Effort: Pulmonary effort is normal.  Musculoskeletal:     Cervical back: Normal range of motion and neck supple.     Thoracic back: Normal.     Lumbar back: Tenderness present. Decreased range of motion. Positive right straight leg raise test. Negative left straight leg raise test.     Left hip: Decreased range of motion. Normal strength.     Right lower leg: No edema.     Left lower leg: No edema.     Comments: Right SI notch TTP  Skin:    General: Skin is warm and dry.   Neurological:     General: No focal deficit present.     Mental Status: She is alert and oriented to person, place, and time.     Motor: No weakness.     Gait: Gait abnormal.  Psychiatric:  Mood and Affect: Mood normal.        Behavior: Behavior normal.        Thought Content: Thought content normal.      BP (!) 144/80 (BP Location: Left Arm, Patient Position: Sitting, Cuff Size: Normal)   Pulse 75   Temp 98.4 F (36.9 C) (Oral)   Ht 5\' 4"  (1.626 m)   Wt 140 lb (63.5 kg)   SpO2 98%   BMI 24.03 kg/m  Wt Readings from Last 3 Encounters:  04/22/23 140 lb (63.5 kg)  04/18/23 140 lb (63.5 kg)  02/03/23 143 lb 12.8 oz (65.2 kg)       Assessment & Plan:   Problem List Items Addressed This Visit   None Visit Diagnoses     Acute right-sided low back pain with right-sided sciatica    -  Primary   Relevant Medications   methocarbamol (ROBAXIN) 500 MG tablet   ketorolac (TORADOL) injection 60 mg (Completed)   Other Relevant Orders   DG HIP UNILAT WITH PELVIS 2-3 VIEWS RIGHT (Completed)   DG Lumbar Spine Complete (Completed)   Pain of right hip       Relevant Medications   methocarbamol (ROBAXIN) 500 MG tablet   ketorolac (TORADOL) injection 60 mg (Completed)   Other Relevant Orders   DG HIP UNILAT WITH PELVIS 2-3 VIEWS RIGHT (Completed)   DG Lumbar Spine Complete (Completed)      Toradol injection given.  Right hip/pelvis and lumbar spine X rays ordered.  Robaxin prescribed.  Tylenol, heating pad, and topical analgesics.  She will follow up with her sports medicine provider next week.   I am having Darlis Loan. Kosar start on methocarbamol. I am also having her maintain her glucosamine-chondroitin, fexofenadine, fluticasone, BIOTIN 5000 PO, PRESCRIPTION MEDICATION, sertraline, clonazePAM, NON FORMULARY, amLODipine, atorvastatin, and predniSONE. We administered ketorolac.  Meds ordered this encounter  Medications   methocarbamol (ROBAXIN) 500 MG tablet    Sig:  Take 1 tablet (500 mg total) by mouth every 8 (eight) hours as needed for muscle spasms.    Dispense:  30 tablet    Refill:  0    Order Specific Question:   Supervising Provider    Answer:   Hillard Danker A [[id number]]   ketorolac (TORADOL) injection 60 mg

## 2023-04-22 NOTE — Telephone Encounter (Signed)
My back is not getting any better I have Sciatica and the pain is unbearable.  She has a appointment on 11.18.24.  Please advise for assistance on what she should do for the time being..  Thanks.

## 2023-04-22 NOTE — Telephone Encounter (Signed)
Patient has been scheduled with vicki

## 2023-04-22 NOTE — Telephone Encounter (Signed)
Can see anyone acutely sooner if pain is severe

## 2023-04-24 ENCOUNTER — Encounter: Payer: Self-pay | Admitting: Family Medicine

## 2023-04-25 ENCOUNTER — Ambulatory Visit: Payer: Medicare PPO | Admitting: Internal Medicine

## 2023-04-26 ENCOUNTER — Ambulatory Visit: Payer: Medicare PPO | Admitting: Sports Medicine

## 2023-04-27 NOTE — Progress Notes (Unsigned)
    Kristi Alvarado [facility] [address] [place] [address] Phone: [phone]   Assessment and Plan:     There are no diagnoses linked to this encounter.  ***   Pertinent previous records reviewed include ***    Follow Up: ***     Subjective:   I, Kristi Alvarado, am serving as a Neurosurgeon for Doctor Richardean Sale  Chief Complaint: low back and right hip pain   HPI:   04/28/2023 Patient is  a 72 year old female with concerns of low back and right hip pain. Patient states   Relevant Historical Information: ***  Additional pertinent review of systems negative.   Current Outpatient Medications:    amLODipine (NORVASC) 2.5 MG tablet, Take 1 tablet (2.5 mg total) by mouth daily., Disp: 90 tablet, Rfl: 2   atorvastatin (LIPITOR) 10 MG tablet, Take 1 tablet (10 mg total) by mouth daily., Disp: 90 tablet, Rfl: 3   BIOTIN 5000 PO, Take 1 tablet by mouth daily., Disp: , Rfl:    clonazePAM (KLONOPIN) 0.5 MG tablet, Take 0.5 mg by mouth at bedtime as needed., Disp: , Rfl:    fexofenadine (ALLEGRA) 180 MG tablet, Take 1 tablet by mouth daily., Disp: , Rfl:    fluticasone (FLONASE) 50 MCG/ACT nasal spray, Place 1 spray into the nose daily., Disp: , Rfl:    glucosamine-chondroitin 500-400 MG tablet, Take 1 tablet by mouth 3 (three) times daily., Disp: , Rfl:    methocarbamol (ROBAXIN) 500 MG tablet, Take 1 tablet (500 mg total) by mouth every 8 (eight) hours as needed for muscle spasms., Disp: 30 tablet, Rfl: 0   NON FORMULARY, Take 1-3 capsules by mouth 2 (two) times daily. Patient takes 2 capsules in the morning and 1 at night, Disp: , Rfl:    predniSONE (STERAPRED UNI-PAK 21 TAB) 10 MG (21) TBPK tablet, As directed x 6 days, Disp: 21 tablet, Rfl: 0   PRESCRIPTION MEDICATION, Regenereyes 1drop left eye twice daily, Disp: , Rfl:    sertraline (ZOLOFT) 50 MG tablet, sertraline 50 mg tablet  TAKE 1 TABLET BY MOUTH EVERY DAY FOR 30 DAYS, Disp: ,  Rfl:    Objective:     There were no vitals filed for this visit.    There is no height or weight on file to calculate BMI.    Physical Exam:    ***   Electronically signed by:  Kristi Alvarado [facility] 7:32 AM 04/27/23

## 2023-04-28 ENCOUNTER — Ambulatory Visit: Payer: Medicare PPO | Admitting: Sports Medicine

## 2023-04-28 VITALS — BP 122/74 | HR 95 | Ht 64.0 in | Wt 139.0 lb

## 2023-04-28 DIAGNOSIS — M5441 Lumbago with sciatica, right side: Secondary | ICD-10-CM

## 2023-04-28 DIAGNOSIS — G8929 Other chronic pain: Secondary | ICD-10-CM | POA: Diagnosis not present

## 2023-04-28 DIAGNOSIS — M7061 Trochanteric bursitis, right hip: Secondary | ICD-10-CM | POA: Diagnosis not present

## 2023-04-28 MED ORDER — MELOXICAM 15 MG PO TABS: 15.0000 mg | ORAL_TABLET | Freq: Every day | ORAL | 0 refills | Status: AC

## 2023-04-28 NOTE — Patient Instructions (Signed)
Low back HEP - Start meloxicam 15 mg daily x2 weeks.  If still having pain after 2 weeks, complete 3rd-week of meloxicam. May use remaining meloxicam as needed once daily for pain control.  Do not to use additional NSAIDs while taking meloxicam.  May use Tylenol [phone] mg 2 to 3 times a day for breakthrough pain. If you can not tolerate meloxicam , call us and we can prescribe Celebrex Take meds with food and pepto if needed Stationary bike, elliptical, and water aerobics 4 week follow up

## 2023-05-06 ENCOUNTER — Encounter: Payer: Self-pay | Admitting: Cardiology

## 2023-05-06 ENCOUNTER — Encounter: Payer: Self-pay | Admitting: Sports Medicine

## 2023-05-16 NOTE — Progress Notes (Unsigned)
    Kristi Alvarado [facility] [address] [place] [address] Phone: [phone]   Assessment and Plan:     There are no diagnoses linked to this encounter.  ***   Pertinent previous records reviewed include ***    Follow Up: ***     Subjective:   I, Vance Hochmuth, am serving as a Neurosurgeon for Doctor Richardean Sale   Chief Complaint: low back and right hip pain    HPI:    04/28/2023 Patient is  a 72 year old female with concerns of low back and right hip pain. Patient states that she has had some improvement with prednisone. Decreased ROM due to pain. Pain for about 4 weeks. Pain radiates down to the right hip . Has received CSI and that helped but she is still very tender  05/17/2023 Patient states   Relevant Historical Information: Hypertension  Additional pertinent review of systems negative.   Current Outpatient Medications:    amLODipine (NORVASC) 2.5 MG tablet, Take 1 tablet (2.5 mg total) by mouth daily., Disp: 90 tablet, Rfl: 2   atorvastatin (LIPITOR) 10 MG tablet, Take 1 tablet (10 mg total) by mouth daily., Disp: 90 tablet, Rfl: 3   BIOTIN 5000 PO, Take 1 tablet by mouth daily., Disp: , Rfl:    clonazePAM (KLONOPIN) 0.5 MG tablet, Take 0.5 mg by mouth at bedtime as needed., Disp: , Rfl:    fexofenadine (ALLEGRA) 180 MG tablet, Take 1 tablet by mouth daily., Disp: , Rfl:    fluticasone (FLONASE) 50 MCG/ACT nasal spray, Place 1 spray into the nose daily., Disp: , Rfl:    glucosamine-chondroitin 500-400 MG tablet, Take 1 tablet by mouth 3 (three) times daily., Disp: , Rfl:    meloxicam (MOBIC) 15 MG tablet, Take 1 tablet (15 mg total) by mouth daily., Disp: 30 tablet, Rfl: 0   methocarbamol (ROBAXIN) 500 MG tablet, Take 1 tablet (500 mg total) by mouth every 8 (eight) hours as needed for muscle spasms., Disp: 30 tablet, Rfl: 0   NON FORMULARY, Take 1-3 capsules by mouth 2 (two) times daily. Patient takes 2 capsules in the  morning and 1 at night, Disp: , Rfl:    predniSONE (STERAPRED UNI-PAK 21 TAB) 10 MG (21) TBPK tablet, As directed x 6 days, Disp: 21 tablet, Rfl: 0   PRESCRIPTION MEDICATION, Regenereyes 1drop left eye twice daily, Disp: , Rfl:    sertraline (ZOLOFT) 50 MG tablet, sertraline 50 mg tablet  TAKE 1 TABLET BY MOUTH EVERY DAY FOR 30 DAYS, Disp: , Rfl:    Objective:     There were no vitals filed for this visit.    There is no height or weight on file to calculate BMI.    Physical Exam:    ***   Electronically signed by:  Kristi Al[facility]ado [facility] 12:15 PM 05/16/23

## 2023-05-17 ENCOUNTER — Ambulatory Visit: Payer: Medicare PPO | Admitting: Sports Medicine

## 2023-05-17 ENCOUNTER — Encounter: Payer: Self-pay | Admitting: Cardiology

## 2023-05-17 ENCOUNTER — Other Ambulatory Visit: Payer: Self-pay

## 2023-05-17 ENCOUNTER — Ambulatory Visit: Payer: Medicare PPO | Attending: Cardiology | Admitting: Cardiology

## 2023-05-17 VITALS — BP 150/72 | HR 72 | Ht 64.0 in | Wt 139.2 lb

## 2023-05-17 VITALS — BP 140/87 | HR 84 | Ht 64.0 in | Wt 140.0 lb

## 2023-05-17 DIAGNOSIS — M7061 Trochanteric bursitis, right hip: Secondary | ICD-10-CM

## 2023-05-17 DIAGNOSIS — I10 Essential (primary) hypertension: Secondary | ICD-10-CM | POA: Diagnosis not present

## 2023-05-17 DIAGNOSIS — E782 Mixed hyperlipidemia: Secondary | ICD-10-CM | POA: Diagnosis not present

## 2023-05-17 DIAGNOSIS — M5441 Lumbago with sciatica, right side: Secondary | ICD-10-CM | POA: Diagnosis not present

## 2023-05-17 DIAGNOSIS — G8929 Other chronic pain: Secondary | ICD-10-CM

## 2023-05-17 DIAGNOSIS — S76011D Strain of muscle, fascia and tendon of right hip, subsequent encounter: Secondary | ICD-10-CM | POA: Diagnosis not present

## 2023-05-17 LAB — LIPID PANEL
Chol/HDL Ratio: 2.6 {ratio} (ref 0.0–4.4)
Cholesterol, Total: 165 mg/dL (ref 100–199)
HDL: 64 mg/dL (ref >39)
LDL Chol Calc (NIH): 88 mg/dL (ref 0–99)
Triglycerides: 66 mg/dL (ref 0–149)
VLDL Cholesterol Cal: 13 mg/dL (ref 5–40)

## 2023-05-17 NOTE — Progress Notes (Unsigned)
Cardiology Office Note:    Date:  05/18/2023   ID:  Kristi Alvarado, DOB [birth year], MRN [id number]  PCP:  Myrlene Broker, MD  Cardiologist:  Thomasene Ripple, DO  Electrophysiologist:  None   Referring MD: Myrlene Broker, *   " I am ok"  History of Present Illness:    Kristi Alvarado is a 72 y.o. female with a hx of elevated coronary calcium, Activon score is 333, hypertension, hyperlipidemia was asked to see cardiology due to the elevated coronary calcium score.  She reports significant fatigue with the Lipitor. Recently transition her off pravastatin to Lipitor. She was on Crestor prior and had significant fatigue and muscle cramp.    Past Medical History:  Diagnosis Date   Allergy    Hypertension     Past Surgical History:  Procedure Laterality Date   BREAST BIOPSY Right    removal benign adenoma right breast   BTL     COLONOSCOPY  10 yrs ago    [facility] exam"    Current Medications: Current Meds  Medication Sig   BIOTIN 5000 PO Take 1 tablet by mouth daily.   clonazePAM (KLONOPIN) 0.5 MG tablet Take 0.5 mg by mouth at bedtime as needed.   fexofenadine (ALLEGRA) 180 MG tablet Take 1 tablet by mouth daily.   fluticasone (FLONASE) 50 MCG/ACT nasal spray Place 1 spray into the nose daily.   glucosamine-chondroitin 500-400 MG tablet Take 1 tablet by mouth 3 (three) times daily.   meloxicam (MOBIC) 15 MG tablet Take 1 tablet (15 mg total) by mouth daily.   methocarbamol (ROBAXIN) 500 MG tablet Take 1 tablet (500 mg total) by mouth every 8 (eight) hours as needed for muscle spasms.   NON FORMULARY Take 1-3 capsules by mouth 2 (two) times daily. Patient takes 2 capsules in the morning and 1 at night   PRESCRIPTION MEDICATION Regenereyes 1drop left eye twice daily   sertraline (ZOLOFT) 50 MG tablet sertraline 50 mg tablet  TAKE 1 TABLET BY MOUTH EVERY DAY FOR 30 DAYS   [DISCONTINUED] atorvastatin (LIPITOR) 10 MG tablet Take 1 tablet (10 mg total) by  mouth daily.     Allergies:   Patient has no known allergies.   Social History   Socioeconomic History   Marital status: Married    Spouse name: Not on file   Number of children: Not on file   Years of education: Not on file   Highest education level: Not on file  Occupational History   Not on file  Tobacco Use   Smoking status: Never   Smokeless tobacco: Never  Vaping Use   Vaping status: Never Used  Substance and Sexual Activity   Alcohol use: Yes    Alcohol/week: 0.0 standard drinks of alcohol    Comment: social/maybe 2 times per month per pt   Drug use: No   Sexual activity: Not on file  Other Topics Concern   Not on file  Social History Narrative   Not on file   Social Determinants of Health   Financial Resource Strain: Low Risk  (10/11/2022)   Overall Financial Resource Strain (CARDIA)    Difficulty of Paying Living Expenses: Not hard at all  Food Insecurity: No Food Insecurity (10/11/2022)   Hunger Vital Sign    Worried About Running Out of Food in the Last Year: Never true    Ran Out of Food in the Last Year: Never true  Transportation Needs: No Transportation Needs (10/11/2022)  PRAPARE - Administrator, Civil Service (Medical): No    Lack of Transportation (Non-Medical): No  Physical Activity: Sufficiently Active (10/11/2022)   Exercise Vital Sign    Days of Exercise per Week: 5 days    Minutes of Exercise per Session: 60 min  Stress: No Stress Concern Present (10/11/2022)   [employer] of Occupational Health - Occupational Stress Questionnaire    Feeling of Stress : Not at all  Social Connections: Unknown (10/11/2022)   Social Connection and Isolation Panel [NHANES]    Frequency of Communication with Friends and Family: More than three times a week    Frequency of Social Gatherings with Friends and Family: Once a week    Attends Religious Services: Patient declined    Database administrator or Organizations: Patient declined    Attends Tax inspector Meetings: Patient declined    Marital Status: Married     Family History: The patient's family history includes Cancer in her maternal grandmother; Colon cancer in her maternal grandmother; Heart disease in her father and mother. There is no history of Esophageal cancer, Stomach cancer, or Breast cancer.  ROS:   Review of Systems  Constitution: Negative for decreased appetite, fever and weight gain.  HENT: Negative for congestion, ear discharge, hoarse voice and sore throat.   Eyes: Negative for discharge, redness, vision loss in right eye and visual halos.  Cardiovascular: Negative for chest pain, dyspnea on exertion, leg swelling, orthopnea and palpitations.  Respiratory: Negative for cough, hemoptysis, shortness of breath and snoring.   Endocrine: Negative for heat intolerance and polyphagia.  Hematologic/Lymphatic: Negative for bleeding problem. Does not bruise/bleed easily.  Skin: Negative for flushing, nail changes, rash and suspicious lesions.  Musculoskeletal: Negative for arthritis, joint pain, muscle cramps, myalgias, neck pain and stiffness.  Gastrointestinal: Negative for abdominal pain, bowel incontinence, diarrhea and excessive appetite.  Genitourinary: Negative for decreased libido, genital sores and incomplete emptying.  Neurological: Negative for brief paralysis, focal weakness, headaches and loss of balance.  Psychiatric/Behavioral: Negative for altered mental status, depression and suicidal ideas.  Allergic/Immunologic: Negative for HIV exposure and persistent infections.    EKGs/Labs/Other Studies Reviewed:    The following studies were reviewed today:   EKG:  The ekg ordered today demonstrates sinus rhythm  Recent Labs: No results found for requested labs within last 365 days.  Recent Lipid Panel    Component Value Date/Time   CHOL 165 05/17/2023 1051   TRIG 66 05/17/2023 1051   HDL 64 05/17/2023 1051   CHOLHDL 2.6 05/17/2023 1051   CHOLHDL 3  03/22/2016 1532   VLDL 26.4 03/22/2016 1532   LDLCALC 88 05/17/2023 1051    Physical Exam:    VS:  BP (!) 150/72 (BP Location: Right Arm, Patient Position: Sitting, Cuff Size: Normal)   Pulse 72   Ht 5\' 4"  (1.626 m)   Wt 139 lb 3.2 oz (63.1 kg)   SpO2 99%   BMI 23.89 kg/m     Wt Readings from Last 3 Encounters:  05/17/23 140 lb (63.5 kg)  05/17/23 139 lb 3.2 oz (63.1 kg)  04/28/23 139 lb (63 kg)     GEN: Well nourished, well developed in no acute distress HEENT: Normal NECK: No JVD; No carotid bruits LYMPHATICS: No lymphadenopathy CARDIAC: S1S2 noted,RRR, no murmurs, rubs, gallops RESPIRATORY:  Clear to auscultation without rales, wheezing or rhonchi  ABDOMEN: Soft, non-tender, non-distended, +bowel sounds, no guarding. EXTREMITIES: No edema, No cyanosis, no clubbing MUSCULOSKELETAL:  No deformity  SKIN: Warm and dry NEUROLOGIC:  Alert and oriented x 3, non-focal PSYCHIATRIC:  Normal affect, good insight  ASSESSMENT:    1. Mixed hyperlipidemia   2. Primary hypertension     PLAN:    Hyperlipidemia Patient reports fatigue on Lipitor.  She has not been able to tolerate Crestor in the past we will get a repeat lab we will likely put her back on pravastatin and Zetia.  Discontinue Lipitor. -Check cholesterol in 4-6 months.  Hypertension Blood pressure readings from home are within normal limits and doctor's office reviewed. Plan to maintain current dose of amlodipine until resolution of current pain issue.  Her blood pressure in the office today is elevated but she thinks strongly that this is due to her sciatica pain and prefers not to have any medication increase. -Continue amlodipine at current dose. -Reevaluate once pain issue is resolved.   The patient is in agreement with the above plan. The patient left the office in stable condition.  The patient will follow up in   Medication Adjustments/Labs and Tests Ordered: Current medicines are reviewed at length with  the patient today.  Concerns regarding medicines are outlined above.  Orders Placed This Encounter  Procedures   Lipid panel   No orders of the defined types were placed in this encounter.   Patient Instructions  Medication Instructions:  Your physician has recommended you make the following change in your medication:  STOP: Lipitor  *If you need a refill on your cardiac medications before your next appointment, please call your pharmacy*   Lab Work: Lipids If you have labs (blood work) drawn today and your tests are completely normal, you will receive your results only by: MyChart Message (if you have MyChart) OR A paper copy in the mail If you have any lab test that is abnormal or we need to change your treatment, we will call you to review the results.    Follow-Up: At [facility], you and your health needs are our priority.  As part of our continuing mission to provide you with exceptional heart care, we have created designated Provider Care Teams.  These Care Teams include your primary Cardiologist (physician) and Advanced Practice Providers (APPs -  Physician Assistants and Nurse Practitioners) who all work together to provide you with the care you need, when you need it.  Your next appointment:   4 month(s)  Provider:   Thomasene Ripple, DO      Adopting a Healthy Lifestyle.  Know what a healthy weight is for you (roughly BMI <25) and aim to maintain this   Aim for 7+ servings of fruits and vegetables daily   65-80+ fluid ounces of water or unsweet tea for healthy kidneys   Limit to max 1 drink of alcohol per day; avoid smoking/tobacco   Limit animal fats in diet for cholesterol and heart health - choose grass fed whenever available   Avoid highly processed foods, and foods high in saturated/trans fats   Aim for low stress - take time to unwind and care for your mental health   Aim for 150 min of moderate intensity exercise weekly for heart health, and  weights twice weekly for bone health   Aim for 7-9 hours of sleep daily   When it comes to diets, agreement about the perfect plan isnt easy to find, even among the experts. Experts at the [facility] [employer] developed an idea known as the Healthy Eating Plate. Just  imagine a plate divided into logical, healthy portions.   The emphasis is on diet quality:   Load up on vegetables and fruits - one-half of your plate: Aim for color and variety, and remember that potatoes dont count.   Go for whole grains - one-quarter of your plate: Whole wheat, barley, wheat berries, quinoa, oats, brown rice, and foods made with them. If you want pasta, go with whole wheat pasta.   Protein power - one-quarter of your plate: Fish, chicken, beans, and nuts are all healthy, versatile protein sources. Limit red meat.   The diet, however, does go beyond the plate, offering a few other suggestions.   Use healthy plant oils, such as olive, canola, soy, corn, sunflower and peanut. Check the labels, and avoid partially hydrogenated oil, which have unhealthy trans fats.   If youre thirsty, drink water. Coffee and tea are good in moderation, but skip sugary drinks and limit milk and dairy products to one or two daily servings.   The type of carbohydrate in the diet is more important than the amount. Some sources of carbohydrates, such as vegetables, fruits, whole grains, and beans-are healthier than others.   Finally, stay active  Signed, Thomasene Ripple, DO  05/18/2023 11:49 AM    [facility]

## 2023-05-17 NOTE — Patient Instructions (Signed)
Mri referral Tylenol [phone] mg 2-3 times a day for pain relief  HEP as tolerated  Follow up 5 days after MRI to discuss results

## 2023-05-17 NOTE — Patient Instructions (Addendum)
Medication Instructions:  Your physician has recommended you make the following change in your medication:  STOP: Lipitor  *If you need a refill on your cardiac medications before your next appointment, please call your pharmacy*   Lab Work: Lipids If you have labs (blood work) drawn today and your tests are completely normal, you will receive your results only by: MyChart Message (if you have MyChart) OR A paper copy in the mail If you have any lab test that is abnormal or we need to change your treatment, we will call you to review the results.    Follow-Up: At [facility], you and your health needs are our priority.  As part of our continuing mission to provide you with exceptional heart care, we have created designated Provider Care Teams.  These Care Teams include your primary Cardiologist (physician) and Advanced Practice Providers (APPs -  Physician Assistants and Nurse Practitioners) who all work together to provide you with the care you need, when you need it.  Your next appointment:   4 month(s)  Provider:   Thomasene Ripple, DO

## 2023-05-22 ENCOUNTER — Other Ambulatory Visit: Payer: Medicare PPO

## 2023-05-22 DIAGNOSIS — M48061 Spinal stenosis, lumbar region without neurogenic claudication: Secondary | ICD-10-CM

## 2023-05-22 DIAGNOSIS — S76011D Strain of muscle, fascia and tendon of right hip, subsequent encounter: Secondary | ICD-10-CM

## 2023-05-22 DIAGNOSIS — G8929 Other chronic pain: Secondary | ICD-10-CM

## 2023-05-22 DIAGNOSIS — M5126 Other intervertebral disc displacement, lumbar region: Secondary | ICD-10-CM | POA: Diagnosis not present

## 2023-05-22 DIAGNOSIS — M5135 Other intervertebral disc degeneration, thoracolumbar region: Secondary | ICD-10-CM | POA: Diagnosis not present

## 2023-05-22 DIAGNOSIS — M47816 Spondylosis without myelopathy or radiculopathy, lumbar region: Secondary | ICD-10-CM | POA: Diagnosis not present

## 2023-05-22 DIAGNOSIS — M7061 Trochanteric bursitis, right hip: Secondary | ICD-10-CM

## 2023-05-26 ENCOUNTER — Ambulatory Visit: Payer: Medicare PPO | Admitting: Sports Medicine

## 2023-05-26 NOTE — Progress Notes (Signed)
[name] [facility] [address] [place] [address] Phone: [phone]   Assessment and Plan:     1. Chronic bilateral low back pain with right-sided sciatica 2. Degeneration of intervertebral disc of lumbar region with discogenic back pain and lower extremity pain -Chronic with exacerbation, significant improvement, subsequent visit - Patient has had significant improvement in right-sided radicular symptoms, right hip symptoms after greater trochanteric/gluteal tendon insertion CSI performed at previous office visit on 05/17/2023.  I originally thought lumbar degenerative changes were likely the cause the patient's low back pain and right sided radicular symptoms, however based on patient's improvement with CSI, piriformis, tight gluteal musculature may have been contributing to radicular symptoms - Reviewed patient's MRI at visit today.  MRI official report has not finalized.  My interpretation: No acute fracture or vertebral collapse.  Lumbar DDD throughout lumbar spine.  Lumbar stenosis most prominent at L4-L5 and L5-S1.  Will await final radiology review and can contact patient if additional findings are present - Continue Tylenol 500 to 1000 mg 2-3 times a day - Continue HEP as tolerated -Recommend gradual return to physical activity.  Start activity at 50% (speed, duration, reps, sets, intensity) and allow 24 hours to assess for worsening pain.  If 50% is well-tolerated, may increase next activity to 75%.  If 75% is well-tolerated, may increase next physical activity to 100%.  If any of these levels cause pain, recommend dropping down to previous level for an additional 2-3 attempts before advancing.  3. Greater trochanteric bursitis of right hip 4. Muscle strain of right gluteal region, subsequent encounter  -Subacute, significant improvement, subsequent visit - Significant improvement in right hip pain and discomfort as well as  significant improvement in radicular symptoms since greater trochanteric/gluteal tendon CSI on 05/17/2023 - Repeat injection could be considered in the future if pain returns  Pertinent previous records reviewed include lumbar MRI 05/22/2023  Follow Up: As needed.  Could consider lumbar epidural CSI versus greater trochanteric CSI based on symptom presentation.  Patient could call and ask for physical therapy at any time   Subjective:   I, [name], am serving as a Neurosurgeon for Doctor Richardean Sale   Chief Complaint: low back and right hip pain    HPI:    04/28/2023 Patient is  a 72 year old female with concerns of low back and right hip pain. Patient states that she has had some improvement with prednisone. Decreased ROM due to pain. Pain for about 4 weeks. Pain radiates down to the right hip . Has received CSI and that helped but she is still very tender   05/17/2023 Patient states that she is not doing well. She is in a lot of pain   05/27/2023 Patient states she is much better   Relevant Historical Information: Hypertension  Additional pertinent review of systems negative.   Current Outpatient Medications:    BIOTIN 5000 PO, Take 1 tablet by mouth daily., Disp: , Rfl:    clonazePAM (KLONOPIN) 0.5 MG tablet, Take 0.5 mg by mouth at bedtime as needed., Disp: , Rfl:    fexofenadine (ALLEGRA) 180 MG tablet, Take 1 tablet by mouth daily., Disp: , Rfl:    fluticasone (FLONASE) 50 MCG/ACT nasal spray, Place 1 spray into the nose daily., Disp: , Rfl:    glucosamine-chondroitin 500-400 MG tablet, Take 1 tablet by mouth 3 (three) times daily., Disp: , Rfl:    meloxicam (MOBIC) 15 MG tablet, Take 1 tablet (  15 mg total) by mouth daily., Disp: 30 tablet, Rfl: 0   methocarbamol (ROBAXIN) 500 MG tablet, Take 1 tablet (500 mg total) by mouth every 8 (eight) hours as needed for muscle spasms., Disp: 30 tablet, Rfl: 0   NON FORMULARY, Take 1-3 capsules by mouth 2 (two) times daily.  Patient takes 2 capsules in the morning and 1 at night, Disp: , Rfl:    PRESCRIPTION MEDICATION, Regenereyes 1drop left eye twice daily, Disp: , Rfl:    sertraline (ZOLOFT) 50 MG tablet, sertraline 50 mg tablet  TAKE 1 TABLET BY MOUTH EVERY DAY FOR 30 DAYS, Disp: , Rfl:    amLODipine (NORVASC) 2.5 MG tablet, Take 1 tablet (2.5 mg total) by mouth daily., Disp: 90 tablet, Rfl: 2   Objective:     Vitals:   05/27/23 1120  BP: (!) 140/78  Pulse: 86  SpO2: 100%  Weight: 140 lb (63.5 kg)  Height: 5\' 4"  (1.626 m)      Body mass index is 24.03 kg/m.    Physical Exam:     Gen: Appears well, nad, nontoxic and pleasant Psych: Alert and oriented, appropriate mood and affect Neuro: sensation intact, strength is 5/5 in upper and lower extremities, muscle tone wnl Skin: no susupicious lesions or rashes  Back - Normal skin, Spine with normal alignment and no deformity.   No tenderness to vertebral process palpation.   Paraspinous muscles are not tender and without spasm NTTP gluteal musculature Straight leg raise negative Trendelenberg negative Piriformis Test negative Gait normal    Electronically signed by:  [name] [facility] 11:42 AM 05/27/23

## 2023-05-27 ENCOUNTER — Ambulatory Visit: Payer: Medicare PPO | Admitting: Sports Medicine

## 2023-05-27 VITALS — BP 140/78 | HR 86 | Ht 64.0 in | Wt 140.0 lb

## 2023-05-27 DIAGNOSIS — G8929 Other chronic pain: Secondary | ICD-10-CM

## 2023-05-27 DIAGNOSIS — S76011D Strain of muscle, fascia and tendon of right hip, subsequent encounter: Secondary | ICD-10-CM

## 2023-05-27 DIAGNOSIS — M5441 Lumbago with sciatica, right side: Secondary | ICD-10-CM

## 2023-05-27 DIAGNOSIS — M51362 Other intervertebral disc degeneration, lumbar region with discogenic back pain and lower extremity pain: Secondary | ICD-10-CM

## 2023-05-27 DIAGNOSIS — M7061 Trochanteric bursitis, right hip: Secondary | ICD-10-CM | POA: Diagnosis not present

## 2023-05-27 NOTE — Patient Instructions (Signed)
Tylenol [phone] mg 2-3 times a day for pain relief   -Recommend gradual return to physical activity.  Start activity at 50% (speed, duration, reps, sets, intensity) and allow 24 hours to assess for worsening pain.  If 50% is well-tolerated, may increase next activity to 75%.  If 75% is well-tolerated, may increase next physical activity to 100%.  If any of these levels cause pain, recommend dropping down to previous level for an additional 2-3 attempts before advancing.  You can call and ask for physical therapy if you would like  As needed follow up

## 2023-06-07 ENCOUNTER — Ambulatory Visit: Payer: Medicare PPO | Admitting: Internal Medicine

## 2023-06-20 ENCOUNTER — Other Ambulatory Visit: Payer: Self-pay | Admitting: Internal Medicine

## 2023-06-20 ENCOUNTER — Ambulatory Visit: Payer: Medicare PPO | Admitting: Sports Medicine

## 2023-06-20 VITALS — HR 81 | Ht 64.0 in | Wt 141.0 lb

## 2023-06-20 DIAGNOSIS — Z01419 Encounter for gynecological examination (general) (routine) without abnormal findings: Secondary | ICD-10-CM | POA: Diagnosis not present

## 2023-06-20 DIAGNOSIS — G8929 Other chronic pain: Secondary | ICD-10-CM

## 2023-06-20 DIAGNOSIS — Z1231 Encounter for screening mammogram for malignant neoplasm of breast: Secondary | ICD-10-CM

## 2023-06-20 DIAGNOSIS — M7061 Trochanteric bursitis, right hip: Secondary | ICD-10-CM

## 2023-06-20 DIAGNOSIS — M51362 Other intervertebral disc degeneration, lumbar region with discogenic back pain and lower extremity pain: Secondary | ICD-10-CM | POA: Diagnosis not present

## 2023-06-20 DIAGNOSIS — N959 Unspecified menopausal and perimenopausal disorder: Secondary | ICD-10-CM | POA: Diagnosis not present

## 2023-06-20 DIAGNOSIS — S76011D Strain of muscle, fascia and tendon of right hip, subsequent encounter: Secondary | ICD-10-CM

## 2023-06-20 DIAGNOSIS — Z6825 Body mass index (BMI) 25.0-25.9, adult: Secondary | ICD-10-CM | POA: Diagnosis not present

## 2023-06-20 MED ORDER — KETOROLAC TROMETHAMINE 60 MG/2ML IM SOLN
60.0000 mg | Freq: Once | INTRAMUSCULAR | Status: AC
  Administered 2023-06-20: 60 mg via INTRAMUSCULAR

## 2023-06-20 MED ORDER — METHYLPREDNISOLONE ACETATE 80 MG/ML IJ SUSP
80.0000 mg | Freq: Once | INTRAMUSCULAR | Status: AC
  Administered 2023-06-20: 80 mg via INTRAMUSCULAR

## 2023-06-20 NOTE — Patient Instructions (Signed)
 Epidural right L4-5 Follow up 2 weeks after to discuss results PT referral

## 2023-06-20 NOTE — Progress Notes (Signed)
 Krist[facility] [address] [place] [address] Phone: [phone]   Assessment and Plan:     1. Chronic bilateral low back pain with right-sided sciatica 2. Degeneration of intervertebral disc of lumbar region with discogenic back pain and lower extremity pain 3. Greater trochanteric bursitis of right hip 4. Muscle strain of right gluteal region, subsequent encounter  -Chronic with exacerbation, complicated, subsequent visit - Complicated presentation that is likely multifactorial including lumbar stenosis with radiculopathy and greater trochanteric bursitis/gluteal tendinitis.  Patient continues to experience right posterior hip pain.  Patient did have significant improvement for 4 weeks after greater trochanteric/gluteal tendon insertion CSI performed on 05/17/2023, however all pain returned restarting physical activity.  Discussed that we hope for 3 months or greater relief from CSI - Recommend epidural right sided L4-L5 to evaluate if lumbar stenosis is a primary contributing factor to patient's pain.  Order placed - Start physical therapy - Patient elected for IM injection of methylprednisone 80 mg/Toradol  60 mg.  Injection given in clinic today and tolerated well.  Pertinent previous records reviewed include MRI lumbar spine 05/22/2023  Follow Up: 2 weeks after epidural CSI to review benefit.  If no improvement or worsening of symptoms, could consider hip MRI versus repeat CSI versus PRP versus ECSWT.  If epidural CSI improved symptoms, could consider repeat epidural when needed.   Subjective:   I, Kristi Alvarado, am serving as a neurosurgeon for [employer]   Chief Complaint: low back and right hip pain    HPI:    04/28/2023 Patient is  a 73 year old female with concerns of low back and right hip pain. Patient states that she has had some improvement with prednisone . Decreased ROM due to pain. Pain for about 4 weeks.  Pain radiates down to the right hip . Has received CSI and that helped but she is still very tender   05/17/2023 Patient states that she is not doing well. She is in a lot of pain    05/27/2023 Patient states she is much better   06/20/2023 Patient states pain came back yesterday no MOI just had acute pain . Tylenol is not touching the pain    Relevant Historical Information: Hypertension Additional pertinent review of systems negative.   Current Outpatient Medications:    amLODipine  (NORVASC ) 2.5 MG tablet, Take 1 tablet (2.5 mg total) by mouth daily., Disp: 90 tablet, Rfl: 2   BIOTIN 5000 PO, Take 1 tablet by mouth daily., Disp: , Rfl:    clonazePAM (KLONOPIN) 0.5 MG tablet, Take 0.5 mg by mouth at bedtime as needed., Disp: , Rfl:    fexofenadine (ALLEGRA) 180 MG tablet, Take 1 tablet by mouth daily., Disp: , Rfl:    fluticasone (FLONASE) 50 MCG/ACT nasal spray, Place 1 spray into the nose daily., Disp: , Rfl:    glucosamine-chondroitin 500-400 MG tablet, Take 1 tablet by mouth 3 (three) times daily., Disp: , Rfl:    meloxicam  (MOBIC ) 15 MG tablet, Take 1 tablet (15 mg total) by mouth daily., Disp: 30 tablet, Rfl: 0   methocarbamol  (ROBAXIN ) 500 MG tablet, Take 1 tablet (500 mg total) by mouth every 8 (eight) hours as needed for muscle spasms., Disp: 30 tablet, Rfl: 0   NON FORMULARY, Take 1-3 capsules by mouth 2 (two) times daily. Patient takes 2 capsules in the morning and 1 at night, Disp: , Rfl:    PRESCRIPTION MEDICATION, Regenereyes 1drop left eye twice daily, Disp: ,  Rfl:    sertraline (ZOLOFT) 50 MG tablet, sertraline 50 mg tablet  TAKE 1 TABLET BY MOUTH EVERY DAY FOR 30 DAYS, Disp: , Rfl:    Objective:     Vitals:   06/20/23 1434  Pulse: 81  SpO2: 98%  Weight: 141 lb (64 kg)  Height: 5' 4 (1.626 m)      Body mass index is 24.2 kg/m.    Physical Exam:    Gen: Appears well, nad, nontoxic and pleasant Psych: Alert and oriented, appropriate mood and affect Neuro:  Decreased sensation over right lateral thigh, right lateral shin compared to left.  Otherwise sensation intact, strength is 5/5 in upper and lower extremities, muscle tone wnl Skin: no susupicious lesions or rashes   Back - Normal skin, Spine with normal alignment and no deformity.   No tenderness to vertebral process palpation.   Paraspinous muscles are not tender and without spasm TTP greater trochanter , gluteal musculature Straight leg raise positive on right Trendelenberg positive right Piriformis Test negative for radicular symptoms, though reproduced right-sided tension Gait normal     Electronically signed by:  Odis Mace [facility] 4:36 PM 06/20/23

## 2023-06-21 ENCOUNTER — Encounter: Payer: Self-pay | Admitting: Sports Medicine

## 2023-06-24 NOTE — Discharge Instructions (Signed)

## 2023-06-28 ENCOUNTER — Ambulatory Visit
Admission: RE | Admit: 2023-06-28 | Discharge: 2023-06-28 | Disposition: A | Discharge status: Home / Self Care | Payer: Medicare PPO | Source: Ambulatory Visit | Attending: Sports Medicine

## 2023-06-28 DIAGNOSIS — M48061 Spinal stenosis, lumbar region without neurogenic claudication: Secondary | ICD-10-CM | POA: Diagnosis not present

## 2023-06-28 DIAGNOSIS — M4727 Other spondylosis with radiculopathy, lumbosacral region: Secondary | ICD-10-CM | POA: Diagnosis not present

## 2023-06-28 DIAGNOSIS — G8929 Other chronic pain: Secondary | ICD-10-CM

## 2023-06-28 MED ORDER — METHYLPREDNISOLONE ACETATE 40 MG/ML INJ SUSP (RADIOLOG
80.0000 mg | Freq: Once | INTRAMUSCULAR | Status: AC
  Administered 2023-06-28: 80 mg via EPIDURAL

## 2023-06-28 MED ORDER — IOPAMIDOL (ISOVUE-M 200) INJECTION 41%
1.0000 mL | Freq: Once | INTRAMUSCULAR | Status: AC
  Administered 2023-06-28: 1 mL via EPIDURAL

## 2023-06-30 ENCOUNTER — Ambulatory Visit: Payer: Medicare PPO

## 2023-07-05 ENCOUNTER — Encounter: Payer: Self-pay | Admitting: Physical Therapy

## 2023-07-05 ENCOUNTER — Other Ambulatory Visit: Payer: Self-pay

## 2023-07-05 ENCOUNTER — Ambulatory Visit: Payer: Medicare PPO | Attending: Sports Medicine | Admitting: Physical Therapy

## 2023-07-05 DIAGNOSIS — G8929 Other chronic pain: Secondary | ICD-10-CM | POA: Diagnosis not present

## 2023-07-05 DIAGNOSIS — M5441 Lumbago with sciatica, right side: Secondary | ICD-10-CM | POA: Diagnosis not present

## 2023-07-05 DIAGNOSIS — M6281 Muscle weakness (generalized): Secondary | ICD-10-CM | POA: Insufficient documentation

## 2023-07-05 NOTE — Therapy (Signed)
OUTPATIENT PHYSICAL THERAPY THORACOLUMBAR EVALUATION   Patient Name: Kristi Alvarado MRN: [id number] DOB:May 05, 1951, 73 y.o., female Today's Date: 07/05/2023  END OF SESSION:  PT End of Session - 07/05/23 1216     Visit Number 1    Date for PT Re-Evaluation 08/30/23    Authorization Type [employer] will submit for authorization    PT Start Time 1220    PT Stop Time 1305    PT Time Calculation (min) 45 min    Activity Tolerance Patient tolerated treatment well             Past Medical History:  Diagnosis Date   Allergy    Hypertension    Past Surgical History:  Procedure Laterality Date   BREAST BIOPSY Right    removal benign adenoma right breast   BTL     COLONOSCOPY  10 yrs ago    [place] [facility] exam"   Patient Active Problem List   Diagnosis Date Noted   Primary hypertension 04/18/2023   Hyperlipidemia 10/26/2022   Routine general medical examination at a health care facility 10/31/2014    PCP: Hillard Danker MD  REFERRING PROVIDER: Richardean Sale DO  REFERRING DIAG: W09.81, G89.29 chronic bil low back pain with right sided sciatica  Rationale for Evaluation and Treatment: Rehabilitation  THERAPY DIAG:  Back pain; weakness  ONSET DATE: end of October  SUBJECTIVE:                                                                                                                                                                                           SUBJECTIVE STATEMENT: Oct twinge in back; worsened; had injections and prednisone (tore stomach up); referred to ortho had 2 shots and did well; Mid January returned abruptly;  had started going to the gym; had ESI L5-S1 last week (helping but not gone) and can manage with Alleve and Tylenol; buckled 1x Used to run until October- 3 miles No prior history  Hasn't gone back to gym since early January: bike 5 min, Elliptical forward/backward, leg press (maybe bothered), upper body things 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 Chronic bilateral low back pain with right-sided sciatica 2. Degeneration of intervertebral disc of lumbar region with discogenic back pain and lower extremity pain 3. Greater trochanteric bursitis of right hip 4. Muscle strain of right gluteal region, subsequent encounter  -Chronic with exacerbation, complicated, subsequent visit - Complicated presentation that is likely multifactorial including lumbar stenosis with radiculopathy and greater trochanteric bursitis/gluteal tendinitis.  Patient continues  to experience right posterior hip pain.  Patient did have significant improvement for 4 weeks after greater trochanteric/gluteal tendon insertion CSI performed on 05/17/2023, however all pain returned restarting physical activity.  Discussed that we hope for 3 months or greater relief from CSI - Recommend epidural right sided L4-L5 to evaluate if lumbar stenosis is a primary contributing factor to patient's pain.  Order placed - Patient elected for IM injection of methylprednisone 80 mg/Toradol 60 mg.  Injection given in clinic today and tolerated well. PERTINENT HISTORY:  HTN; mild high BP  PAIN:  Are you having pain? Yes NPRS scale: 6/10 Pain location: tender right low back; numbness right post/lateral and anteriorthigh, right anterior shin spasms, tingling and numbness constant Pain orientation: Right  PAIN TYPE: burning and tingling Pain description: constant and tingling  Aggravating factors: walk fast; lying on left side; sitting Relieving factors: after meds for 2 hours; right sidelying; take some weight off right leg; tested on Sunday and walked 1/4 mile felt better   PRECAUTIONS: None   WEIGHT BEARING RESTRICTIONS: No  FALLS:  Has patient fallen in last 6 months? No  LIVING ENVIRONMENT: Lives with: lives with their spouse Lives in: House/apartment [address] and  older community  Stairs: No  OCCUPATION: retired  Human resources officer: running;   PLOF: Independent takes care of husband (Parkinsons) tries to limit lifting PATIENT GOALS: walk without pain, walk long distances, later return to running; be able to lift 40# bag of birdseed or 50# bag of deer corn (drags)   NEXT MD VISIT: next Tuesday 2/4 OBJECTIVE:  Note: Objective measures were completed at Evaluation unless otherwise noted.  DIAGNOSTIC FINDINGS:  MRI December 2024 IMPRESSION: 1. Severe L4-L5 facet arthrosis with moderate spinal canal stenosis and severe right neural foraminal stenosis. Round, low signal structure extending into the right neural foramen may be a sequestered disc fragment or synovial cyst. 2. Mild L3-L4 spinal canal stenosis.  Bone Density:  mild osteopenia femur? (not in Epic pt to look up findings)  PATIENT SURVEYS:  Modified Oswestry 44%   COGNITION: Overall cognitive status: Within functional limits for tasks assessed     SENSATION: Light touch: Impaired right thigh and anterior lower leg  MUSCLE LENGTH: Decreased right hip flexor length in sidelying and prone POSTURE: decreased lumbar lordosis and right iliac crest higher than left; moderate to severe left ankle pronation  PALPATION: Marked tenderness and spasm in right gluteals and piriformis  LUMBAR ROM:  DKTC mild increase but no worse ; prone lying in hip/buttock ; POE less pain; press up less tingling in leg?   AROM eval  Flexion 25% limited  Extension 25% limited  Right lateral flexion 10% limited  Left lateral flexion 10% limited  Right rotation   Left rotation    (Blank rows = not tested)  TRUNK STRENGTH:  Decreased activation of transverse abdominus muscles; abdominals 4-/5; decreased activation of lumbar multifidi; trunk extensors 4-/5  LOWER EXTREMITY ROM:   20% reduction in right hip mobility LOWER EXTREMITY MMT:  Able to rise from standard chair without UE assist  MMT Right eval  Left eval  Hip flexion 4 5  Hip extension 4 5  Hip abduction 3+ 5  Hip adduction    Hip internal rotation    Hip external rotation 4 5  Knee flexion 4 5  Knee extension 4 5  Ankle dorsiflexion 3+ 3+  Ankle plantarflexion 3+ 3+  Ankle inversion    Ankle eversion     (Blank rows = not  tested)  LUMBAR SPECIAL TESTS:  Negative slump and SLR but + neural tension   FUNCTIONAL TESTS:  SLS 2 sec right/left 5x STS 15.18 sec no UE use 3 MWT 443 feet  pain 8/10 following, increased numbness/tingling  GAIT: Comments: decreased gait speed, increased forward trunk flexion, forward head  TREATMENT DATE: 07/05/23  Sleep with pillow b/w knees Avoid crossing legs when sitting Stoplight system (peripheralization and centralization) Trial of extension ex Pt to try her home TENs Discussed OK on UE ex at the gym                                                                                                                                PATIENT EDUCATION:  Education details: Educated patient on anatomy and physiology of current symptoms, prognosis, plan of care as well as initial self care strategies to promote recovery Person educated: Patient Education method: Explanation Education comprehension: verbalized understanding  HOME EXERCISE PROGRAM: Access Code: [email or web address] URL: [email or web address] Date: 07/05/2023 Prepared by: Lavinia Sharps  Exercises - Lying Prone  - 1 x daily - 7 x weekly - 1 sets - 10 reps - Prone Press Up On Elbows  - 1 x daily - 7 x weekly - 1 sets - 5 reps - Prone Press Up  - 1 x daily - 7 x weekly - 1 sets - 5-10 reps  ASSESSMENT:  CLINICAL IMPRESSION: Patient is a 73 y.o. female who was seen today for physical therapy evaluation and treatment for bilateral low back pain with right sided sciatica. Radicular symptoms including motor and sensory symptoms in right posterior/lateral thigh and posterior/medial lower leg.  Symptoms worsened with walking  quickly, left sidelying and sitting.  Weakness in right LE musculature grossly 3+ to 4/5.  She has been unable to walk for exercise, run and is limited in ability to lift heavier objects.  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  No clear directional preference although she does report a reduction in numbness/tingling with extension biased movement.  She would benefit from PT to address these impairments.   OBJECTIVE IMPAIRMENTS: decreased activity tolerance, difficulty walking, decreased ROM, decreased strength, impaired perceived functional ability, increased muscle spasms, impaired flexibility, postural dysfunction, and pain.   ACTIVITY LIMITATIONS: lifting, sitting, standing, sleeping, locomotion level, and caring for others  PARTICIPATION LIMITATIONS: meal prep, cleaning, laundry, interpersonal relationship, and community activity  PERSONAL FACTORS: 1-2 comorbidities: HTN, osteopenia  are also affecting patient's functional outcome.   REHAB POTENTIAL: Good  CLINICAL DECISION MAKING: Stable/uncomplicated  EVALUATION COMPLEXITY: Low   GOALS: Goals reviewed with patient? Yes  SHORT TERM GOALS: Target date: 08/02/2023    The patient will demonstrate knowledge of basic self care strategies and exercises to promote healing  Baseline: Goal status: INITIAL  2.  The patient will report a 30% improvement in pain levels with functional activities which are currently difficult including walking, sitting Baseline:  Goal status: INITIAL  3.  The patient will have improved gait stamina and speed needed to ambulate 800 feet in 6 minutes  Baseline:  Goal status: INITIAL  4.  5x STS improved to 13 sec indicating improved LE strength Baseline:  Goal status: INITIAL    LONG TERM GOALS: Target date: 08/30/2023   The patient will be independent in a safe self progression of a home exercise program to promote further recovery of function  Baseline:  Goal status: INITIAL  2.   The patient will report a 70% improvement in pain levels with functional activities which are currently difficult including walking, standing, sitting Baseline:  Goal status: INITIAL  3.  The patient will be able to walk 1/4 to 1/2 mile with pain level < 5/10 Baseline:  Goal status: INITIAL  4.  The patient will have improved trunk flexor and extensor muscle strength to at least 4+/5 needed for lifting medium weight objects such as bird seed and deer corn bags Baseline:  Goal status: INITIAL  5.  Modified Oswestry score improved to 32% indicating improved function with less pain Baseline:  Goal status: INITIAL   PLAN:  PT FREQUENCY: 2x/week  PT DURATION: 8 weeks  PLANNED INTERVENTIONS: 97164- PT Re-evaluation, 97110-Therapeutic exercises, 97530- Therapeutic activity, 97112- Neuromuscular re-education, 97535- Self Care, 40981- Manual therapy, [id number]- Aquatic Therapy, 97014- Electrical stimulation (unattended), [id number]- Electrical stimulation (manual), [id number]- Ultrasound, [id number]- Traction (mechanical), [id number]- Ionotophoresis 4mg /ml Dexamethasone, Patient/Family education, Taping, Dry Needling, Joint mobilization, Spinal manipulation, Spinal mobilization, Cryotherapy, and Moist heat.  PLAN FOR NEXT SESSION: assess response to prone extension trial; DN to right gluteals, piriformis and lumbar region (radicular pattern); neural mobilization  Lavinia Sharps, PT 07/05/23 10:06 PM Phone: [phone] Fax: [phone]

## 2023-07-06 ENCOUNTER — Ambulatory Visit
Admission: RE | Admit: 2023-07-06 | Discharge: 2023-07-06 | Disposition: A | Discharge status: Home / Self Care | Payer: Medicare PPO | Source: Ambulatory Visit | Attending: Internal Medicine | Admitting: Internal Medicine

## 2023-07-06 DIAGNOSIS — Z1231 Encounter for screening mammogram for malignant neoplasm of breast: Secondary | ICD-10-CM | POA: Diagnosis not present

## 2023-07-08 ENCOUNTER — Encounter: Payer: Self-pay | Admitting: Internal Medicine

## 2023-07-08 LAB — HM MAMMOGRAPHY

## 2023-07-11 NOTE — Progress Notes (Signed)
 [facility] [address] [place] [address] Phone: [phone]   Assessment and Plan:     1. Chronic bilateral low back pain with right-sided sciatica 2. Degeneration of intervertebral disc of lumbar region with discogenic back pain and lower extremity pain -Chronic with exacerbation, subsequent visit - Overall significant improvement, 80%, after right-sided epidural CSI at L5-S1 performed on 06/28/2023.  Consistent with lumbar degenerative changes, stenosis as patient's primary pathology and pain generator - Ideally based on patient's physical exam and MRI, we would like epidural to be placed at L4-L5, however upon review, interventional radiology injected at right-sided L5-S1 due to degree of stenosis and effacement of epidural fat.  I believe patient could receive even further improvement with additional epidural CSI.  Recommend placing at right sided L3-L4 with the hope that gravitational pull will help symptoms at L4-L5 -Continue physical therapy and home exercises - May continue Tylenol for day-to-day pain relief  Pertinent previous records reviewed include epidural procedure note 06/28/2023, lumbar MRI 05/22/2023  Follow Up: 2 weeks after epidural CSI to review improvement.  Could consider additional CSI if needed   Subjective:   I, Kristi Alvarado, am serving as a neurosurgeon for Kristi Alvarado   Chief Complaint: low back and right hip pain    HPI:    04/28/2023 Patient is  a 73 year old female with concerns of low back and right hip pain. Patient states that she has had some improvement with prednisone . Decreased ROM due to pain. Pain for about 4 weeks. Pain radiates down to the right hip . Has received CSI and that helped but she is still very tender   05/17/2023 Patient states that she is not doing well. She is in a lot of pain    05/27/2023 Patient states she is much better    06/20/2023 Patient states pain  came back yesterday no MOI just had acute pain . Tylenol is not touching the pain   07/12/2023 Patient states that she is doing much better    Relevant Historical Information: Hypertension  Additional pertinent review of systems negative.   Current Outpatient Medications:    BIOTIN 5000 PO, Take 1 tablet by mouth daily., Disp: , Rfl:    clonazePAM (KLONOPIN) 0.5 MG tablet, Take 0.5 mg by mouth at bedtime as needed., Disp: , Rfl:    fexofenadine (ALLEGRA) 180 MG tablet, Take 1 tablet by mouth daily., Disp: , Rfl:    fluticasone (FLONASE) 50 MCG/ACT nasal spray, Place 1 spray into the nose daily., Disp: , Rfl:    glucosamine-chondroitin 500-400 MG tablet, Take 1 tablet by mouth 3 (three) times daily., Disp: , Rfl:    methocarbamol  (ROBAXIN ) 500 MG tablet, Take 1 tablet (500 mg total) by mouth every 8 (eight) hours as needed for muscle spasms., Disp: 30 tablet, Rfl: 0   NON FORMULARY, Take 1-3 capsules by mouth 2 (two) times daily. Patient takes 2 capsules in the morning and 1 at night, Disp: , Rfl:    PRESCRIPTION MEDICATION, Regenereyes 1drop left eye twice daily, Disp: , Rfl:    sertraline (ZOLOFT) 50 MG tablet, sertraline 50 mg tablet  TAKE 1 TABLET BY MOUTH EVERY DAY FOR 30 DAYS, Disp: , Rfl:    amLODipine  (NORVASC ) 2.5 MG tablet, Take 1 tablet (2.5 mg total) by mouth daily., Disp: 90 tablet, Rfl: 2   meloxicam  (MOBIC ) 15 MG tablet, Take 1 tablet (15 mg total) by mouth daily., Disp: 30 tablet,  Rfl: 0   Objective:     Vitals:   07/12/23 1044  Pulse: 81  SpO2: 100%  Weight: 137 lb (62.1 kg)  Height: 5' 4 (1.626 m)      Body mass index is 23.52 kg/m.    Physical Exam:    Gen: Appears well, nad, nontoxic and pleasant Psych: Alert and oriented, appropriate mood and affect Neuro: Decreased sensation over right lateral thigh, right lateral shin compared to left.  Otherwise sensation intact, strength is 5/5 in upper and lower extremities, muscle tone wnl Skin: no susupicious lesions  or rashes   Back - Normal skin, Spine with normal alignment and no deformity.   No tenderness to vertebral process palpation.   Paraspinous muscles are not tender and without spasm TTP mildly greater trochanter , gluteal musculature Straight leg raise positive on right Trendelenberg positive right Piriformis Test negative for radicular symptoms, though reproduced right-sided tension Gait normal     Electronically signed by:  Kristi Alvarado [facility] 11:02 AM 07/12/23

## 2023-07-12 ENCOUNTER — Ambulatory Visit: Payer: Medicare PPO | Admitting: Sports Medicine

## 2023-07-12 VITALS — HR 81 | Ht 64.0 in | Wt 137.0 lb

## 2023-07-12 DIAGNOSIS — M51362 Other intervertebral disc degeneration, lumbar region with discogenic back pain and lower extremity pain: Secondary | ICD-10-CM | POA: Diagnosis not present

## 2023-07-12 DIAGNOSIS — G8929 Other chronic pain: Secondary | ICD-10-CM | POA: Diagnosis not present

## 2023-07-12 NOTE — Patient Instructions (Signed)
Epidural right L3-4  Follow up 2 weeks after to discuss results

## 2023-07-13 ENCOUNTER — Ambulatory Visit: Payer: Medicare PPO | Attending: Sports Medicine | Admitting: Physical Therapy

## 2023-07-13 DIAGNOSIS — M6281 Muscle weakness (generalized): Secondary | ICD-10-CM | POA: Diagnosis not present

## 2023-07-13 DIAGNOSIS — M5441 Lumbago with sciatica, right side: Secondary | ICD-10-CM | POA: Insufficient documentation

## 2023-07-13 DIAGNOSIS — G8929 Other chronic pain: Secondary | ICD-10-CM | POA: Insufficient documentation

## 2023-07-13 NOTE — Therapy (Signed)
 OUTPATIENT PHYSICAL THERAPY THORACOLUMBAR PROGRESS NOTE   Patient Name: Kristi Alvarado MRN: [id number] DOB:December 28, 1950, 73 y.o., female Today's Date: 07/13/2023  END OF SESSION:  PT End of Session - 07/13/23 1151     Visit Number 2    Number of Visits 16    Date for PT Re-Evaluation 08/30/23    Authorization Type [employer] 16  1/28-3/25    PT Start Time 1149    PT Stop Time 1230    PT Time Calculation (min) 41 min    Activity Tolerance Patient tolerated treatment well             Past Medical History:  Diagnosis Date   Allergy    Hypertension    Past Surgical History:  Procedure Laterality Date   BREAST BIOPSY Right    removal benign adenoma right breast   BTL     COLONOSCOPY  10 yrs ago    in [facility] exam   Patient Active Problem List   Diagnosis Date Noted   Primary hypertension 04/18/2023   Hyperlipidemia 10/26/2022   Routine general medical examination at a health care facility 10/31/2014    PCP: Rollene Norris MD  REFERRING PROVIDER: Leonce Katz DO  REFERRING DIAG: F45.58, G89.29 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  Feels like my leg might give way.  Rode the bike for 5 min at the gym and that was OK. Last ESI 2 weeks ago. I think the home TENS has helped.  Walked 1/2 mile yesterday but stopped in the middle to rest.  Mild symptoms later but no pain meds needed.       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 Chronic bilateral low back pain with right-sided sciatica 2. Degeneration of intervertebral disc of lumbar region with discogenic back pain and lower extremity pain 3. Greater trochanteric bursitis of right hip 4. Muscle strain of right gluteal region, subsequent encounter  -Chronic with exacerbation, complicated, subsequent visit - Complicated presentation that is likely multifactorial including lumbar stenosis with radiculopathy and greater trochanteric bursitis/gluteal tendinitis.  Patient continues to experience right posterior hip pain.  Patient did have significant improvement for 4 weeks after greater trochanteric/gluteal tendon insertion CSI performed on 05/17/2023, however all pain returned restarting physical activity.  Discussed that we hope for 3 months or greater relief from CSI - Recommend epidural right sided L4-L5 to evaluate if lumbar stenosis is a primary contributing factor to patient's pain.  Order placed - Patient elected for IM injection of methylprednisone 80 mg/Toradol  60 mg.  Injection given in clinic today and tolerated well. PERTINENT HISTORY:  HTN; mild high BP  PAIN:  Are you having pain? Yes NPRS scale: 3/10 no numbness right now, mostly in the AM and with sitting Pain location: tender right low back; numbness right post/lateral and anteriorthigh, right anterior shin spasms, tingling and numbness constant Pain orientation: Right  PAIN TYPE: burning and tingling Pain description: constant and tingling  Aggravating factors: walk fast; lying  on left side; sitting Relieving factors: after meds for 2 hours; right sidelying; take some weight off right leg; tested on Sunday and walked 1/4 mile felt better   PRECAUTIONS: None   WEIGHT BEARING RESTRICTIONS: No  FALLS:  Has patient fallen in last 6 months? No  LIVING ENVIRONMENT: Lives with: lives with their spouse Lives in: House/apartment 79 and older community  Stairs: No  OCCUPATION: retired  HUMAN RESOURCES OFFICER: running;   PLOF: Independent takes care of husband (Parkinsons) tries to limit lifting PATIENT GOALS: walk without pain, walk long distances, later return to running; be able to lift 40# bag of birdseed or 50# bag of deer corn (drags)   NEXT MD VISIT: next Tuesday 2/4 OBJECTIVE:  Note: Objective measures were completed at Evaluation unless otherwise noted.  DIAGNOSTIC FINDINGS:  MRI December 2024 IMPRESSION: 1. Severe L4-L5 facet arthrosis with moderate spinal canal stenosis and severe right neural foraminal stenosis. Round, low signal structure extending into the right neural foramen may be a sequestered disc fragment or synovial cyst. 2. Mild L3-L4 spinal canal stenosis.  Bone Density:  mild osteopenia femur? (not in Epic pt to look up findings)  PATIENT SURVEYS:  Modified Oswestry 44%   COGNITION: Overall cognitive status: Within functional limits for tasks assessed     SENSATION: Light touch: Impaired right thigh and anterior lower leg  MUSCLE LENGTH: Decreased right hip flexor length in sidelying and prone POSTURE: decreased lumbar lordosis and right iliac crest higher than left; moderate to severe left ankle pronation  PALPATION: Marked tenderness and spasm in right gluteals and piriformis  LUMBAR ROM:  DKTC mild increase but no worse ; prone lying in hip/buttock ; POE less pain; press up less tingling in leg?   AROM eval  Flexion 25% limited  Extension 25% limited  Right lateral flexion 10% limited  Left lateral flexion 10% limited  Right  rotation   Left rotation    (Blank rows = not tested)  TRUNK STRENGTH:  Decreased activation of transverse abdominus muscles; abdominals 4-/5; decreased activation of lumbar multifidi; trunk extensors 4-/5  LOWER EXTREMITY ROM:   20% reduction in right hip mobility  LOWER EXTREMITY MMT:  Able to rise from standard chair without UE assist  MMT Right eval Left eval  Hip flexion 4 5  Hip extension 4 5  Hip abduction 3+ 5  Hip adduction    Hip internal rotation    Hip external rotation 4 5  Knee flexion 4 5  Knee extension 4 5  Ankle dorsiflexion 3+ 3+  Ankle plantarflexion 3+ 3+  Ankle inversion    Ankle eversion     (Blank rows = not tested)  LUMBAR SPECIAL TESTS:  Negative slump and SLR but + neural tension   FUNCTIONAL TESTS:  SLS 2 sec right/left 5x STS 15.18 sec no UE use 3 MWT 443 feet  pain 8/10 following, increased numbness/tingling  GAIT: Comments: decreased gait speed, increased forward trunk flexion, forward head TREATMENT DATE: 07/13/23  Prone press ups 5x small range, 5x full range  Sidelying clam painful 8x Supine bent knee raise with transverse ab draw in 10x (added to HEP) Supine hand to knee push 5 sec hold 8x right/left (added to HEP) Supine red band isometric clam push (no pain) (added to HEP) Trigger Point Dry Needling  Initial Treatment: Pt instructed on Dry Needling rational, procedures, and possible side effects. Pt instructed to expect mild to moderate muscle soreness later in the day and/or into the next day.  Pt instructed in methods to reduce muscle soreness. Pt instructed to continue prescribed HEP. Patient verbalized understanding of these instructions and education.   Patient Verbal Consent Given: Yes Education Handout Provided: Yes Muscles Treated: right only lumbar multifidi; right gluteals, right piriformis, right proximal HS Electrical Stimulation Performed: Yes, Parameters: 1.5 ma 80 pps 8 min 2 channels Treatment Response/Outcome:  decreased pain   TREATMENT DATE: 07/05/23  Sleep with pillow b/w knees Avoid crossing legs when sitting Stoplight system (peripheralization and centralization) Trial of extension ex Pt to try her home TENs Discussed OK on UE ex at the gym                                                                                                                                PATIENT EDUCATION:  Education details: Educated patient on anatomy and physiology of current symptoms, prognosis, plan of care as well as initial self care strategies to promote recovery Person educated: Patient Education method: Explanation Education comprehension: verbalized understanding  HOME EXERCISE PROGRAM: Access Code: [email or web address] URL: [email or web address] Date: 07/13/2023 Prepared by: Glade Pesa  Exercises - Lying Prone  - 1 x daily - 7 x weekly - 1 sets - 10 reps - Prone Press Up On Elbows  - 1 x daily - 7 x weekly - 1 sets - 5 reps - Prone Press Up  - 1 x daily - 7 x weekly - 1 sets - 5-10 reps - Supine March  - 1 x daily - 7 x weekly - 1 sets - 8-10 reps - Hooklying Isometric Hip Flexion with Opposite Arm  -  1 x daily - 7 x weekly - 1 sets - 8-10 reps - 5 hold - Hooklying Clamshell with Resistance  - 1 x daily - 7 x weekly - 1 sets - 8-10 reps - 5 hold  ASSESSMENT:  CLINICAL IMPRESSION: Tayanna has been able to increase her activity level somewhat and has lower symptom irritability. No symptoms with prone press up but discussed trial of press ups with the presence of LE symptoms in hopes of abolishment.   Initiated lumbo/pelvic and hip isometric type ex with good response, dynamic hip movement (sidelying clam increased pain).  The patient had a positive initial response to DN with much improved soft tissue mobility and decreased size and number of tender points.  DN combined with ES in radicular pattern for further physiologic benefit. Therapist monitoring response and educating patient on what to  expect following DN and how to optimize benefit with specific exercise.  Anticipate pain intensity and mobility will continue to improve over the next few days.     OBJECTIVE IMPAIRMENTS: decreased activity tolerance, difficulty walking, decreased ROM, decreased strength, impaired perceived functional ability, increased muscle spasms, impaired flexibility, postural dysfunction, and pain.   ACTIVITY LIMITATIONS: lifting, sitting, standing, sleeping, locomotion level, and caring for others  PARTICIPATION LIMITATIONS: meal prep, cleaning, laundry, interpersonal relationship, and community activity  PERSONAL FACTORS: 1-2 comorbidities: HTN, osteopenia  are also affecting patient's functional outcome.   REHAB POTENTIAL: Good  CLINICAL DECISION MAKING: Stable/uncomplicated  EVALUATION COMPLEXITY: Low   GOALS: Goals reviewed with patient? Yes  SHORT TERM GOALS: Target date: 08/02/2023    The patient will demonstrate knowledge of basic self care strategies and exercises to promote healing  Baseline: Goal status: INITIAL  2.  The patient will report a 30% improvement in pain levels with functional activities which are currently difficult including walking, sitting Baseline:  Goal status: INITIAL  3.  The patient will have improved gait stamina and speed needed to ambulate 800 feet in 6 minutes  Baseline:  Goal status: INITIAL  4.  5x STS improved to 13 sec indicating improved LE strength Baseline:  Goal status: INITIAL    LONG TERM GOALS: Target date: 08/30/2023   The patient will be independent in a safe self progression of a home exercise program to promote further recovery of function  Baseline:  Goal status: INITIAL  2.  The patient will report a 70% improvement in pain levels with functional activities which are currently difficult including walking, standing, sitting Baseline:  Goal status: INITIAL  3.  The patient will be able to walk 1/4 to 1/2 mile with pain level <  5/10 Baseline:  Goal status: INITIAL  4.  The patient will have improved trunk flexor and extensor muscle strength to at least 4+/5 needed for lifting medium weight objects such as bird seed and deer corn bags Baseline:  Goal status: INITIAL  5.  Modified Oswestry score improved to 32% indicating improved function with less pain Baseline:  Goal status: INITIAL   PLAN:  PT FREQUENCY: 2x/week  PT DURATION: 8 weeks  PLANNED INTERVENTIONS: 97164- PT Re-evaluation, 97110-Therapeutic exercises, 97530- Therapeutic activity, 97112- Neuromuscular re-education, 97535- Self Care, 02859- Manual therapy, [id number]- Aquatic Therapy, 97014- Electrical stimulation (unattended), [id number]- Electrical stimulation (manual), [id number]- Ultrasound, [id number]- Traction (mechanical), [id number]- Ionotophoresis 4mg /ml Dexamethasone, Patient/Family education, Taping, Dry Needling, Joint mobilization, Spinal manipulation, Spinal mobilization, Cryotherapy, and Moist heat.  PLAN FOR NEXT SESSION: assess response to prone extension trial when LE symptoms are present; assess response to DN  with ES to right gluteals, piriformis and lumbar region (radicular pattern); neural mobilization; another upcoming ESI to be scheduled  Glade Pesa, PT 07/13/23 5:13 PM Phone: [phone] Fax: [phone]

## 2023-07-13 NOTE — Patient Instructions (Signed)

## 2023-07-16 ENCOUNTER — Encounter: Payer: Self-pay | Admitting: Sports Medicine

## 2023-07-17 NOTE — Therapy (Signed)
 OUTPATIENT PHYSICAL THERAPY THORACOLUMBAR PROGRESS NOTE   Patient Name: Kristi Alvarado MRN: [id number] DOB:[birth year], 73 y.o., female Today's Date: 07/18/2023  END OF SESSION:  PT End of Session - 07/18/23 1017     Visit Number 3    Number of Visits 16    Date for PT Re-Evaluation 08/30/23    Authorization Type [employer] 16  1/28-3/25    PT Start Time 1008    PT Stop Time 1052    PT Time Calculation (min) 44 min    Activity Tolerance Patient tolerated treatment well    Behavior During Therapy WFL for tasks assessed/performed              Past Medical History:  Diagnosis Date   Allergy    Hypertension    Past Surgical History:  Procedure Laterality Date   BREAST BIOPSY Right    removal benign adenoma right breast   BTL     COLONOSCOPY  10 yrs ago    [facility]"   Patient Active Problem List   Diagnosis Date Noted   Primary hypertension 04/18/2023   Hyperlipidemia 10/26/2022   Routine general medical examination at a health care facility 10/31/2014    PCP: Bambi Lever MD  REFERRING PROVIDER: Ulysees Gander DO  REFERRING DIAG: W09.81, G89.29 chronic 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 Pain in the R hip and numbness into leg.  TENS unit did not help either and normally does. The DN helped. Next epidural scheduled for Thursday. I'm having some balance issues with walking.     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  Chronic bilateral low back pain with right-sided sciatica 2. Degeneration of intervertebral disc of lumbar region with discogenic back pain and lower extremity pain 3. Greater trochanteric bursitis of right hip 4. Muscle strain of right gluteal region, subsequent encounter  -Chronic with exacerbation, complicated, subsequent visit - Complicated presentation that is likely multifactorial including lumbar stenosis with radiculopathy and greater trochanteric bursitis/gluteal tendinitis.  Patient continues to experience right posterior hip pain.  Patient did have significant improvement for 4 weeks after greater trochanteric/gluteal tendon insertion CSI performed on 05/17/2023, however all pain returned restarting physical activity.  Discussed that we hope for 3 months or greater relief from CSI - Recommend epidural right sided L4-L5 to evaluate if lumbar stenosis is a primary contributing factor to patient's pain.  Order placed - Patient elected for IM injection of methylprednisone 80 mg/Toradol  60 mg.  Injection given in clinic today and tolerated well. PERTINENT HISTORY:  HTN; mild high BP  PAIN:  Are you having pain? Yes NPRS scale: 7/10 pain in the R hip, numbness (see below) Pain location: tender right low back; numbness right post/lateral and anteriorthigh, right anterior shin spasms, tingling and numbness constant Pain orientation: Right  PAIN TYPE: burning and tingling Pain description: constant and tingling  Aggravating factors: walk fast;  lying on left side; sitting Relieving factors: after meds for 2 hours; right sidelying; take some weight off right leg; tested on Sunday and walked 1/4 mile felt better   PRECAUTIONS: None   WEIGHT BEARING RESTRICTIONS: No  FALLS:  Has patient fallen in last 6 months? No  LIVING ENVIRONMENT: Lives with: lives with their spouse Lives in: House/apartment [address] older community  Stairs: No  OCCUPATION: retired  Human resources officer: running;   PLOF: Independent takes care of husband (Parkinsons) tries to limit lifting PATIENT GOALS: walk without pain, walk long distances, later return to running; be able to lift 40# bag of birdseed or 50# bag of deer corn (drags)   NEXT MD VISIT: next Tuesday 2/4 OBJECTIVE:  Note: Objective measures were completed at Evaluation unless otherwise noted.  DIAGNOSTIC FINDINGS:  MRI December 2024 IMPRESSION: 1. Severe L4-L5 facet arthrosis with moderate spinal canal stenosis and severe right neural foraminal stenosis. Round, low signal structure extending into the right neural foramen may be a sequestered disc fragment or synovial cyst. 2. Mild L3-L4 spinal canal stenosis.  Bone Density:  mild osteopenia femur? (not in Epic pt to look up findings)  PATIENT SURVEYS:  Modified Oswestry 44%   COGNITION: Overall cognitive status: Within functional limits for tasks assessed     SENSATION: Light touch: Impaired right thigh and anterior lower leg  MUSCLE LENGTH: Decreased right hip flexor length in sidelying and prone POSTURE: decreased lumbar lordosis and right iliac crest higher than left; moderate to severe left ankle pronation  PALPATION: Marked tenderness and spasm in right gluteals and piriformis  LUMBAR ROM:  DKTC mild increase but no worse ; prone lying in hip/buttock ; POE less pain; press up less tingling in leg?   AROM eval  Flexion 25% limited  Extension 25% limited  Right lateral flexion 10% limited  Left lateral flexion 10% limited   Right rotation   Left rotation    (Blank rows = not tested)  TRUNK STRENGTH:  Decreased activation of transverse abdominus muscles; abdominals 4-/5; decreased activation of lumbar multifidi; trunk extensors 4-/5  LOWER EXTREMITY ROM:   20% reduction in right hip mobility LOWER EXTREMITY MMT:  Able to rise from standard chair without UE  assist  MMT Right eval Left eval  Hip flexion 4 5  Hip extension 4 5  Hip abduction 3+ 5  Hip adduction    Hip internal rotation    Hip external rotation 4 5  Knee flexion 4 5  Knee extension 4 5  Ankle dorsiflexion 3+ 3+  Ankle plantarflexion 3+ 3+  Ankle inversion    Ankle eversion     (Blank rows = not tested)  LUMBAR SPECIAL TESTS:  Negative slump and SLR but + neural tension   FUNCTIONAL TESTS:  SLS 2 sec right/left 5x STS 15.18 sec no UE use 3 MWT 443 feet  pain 8/10 following, increased numbness/tingling  GAIT: Comments: decreased gait speed, increased forward trunk flexion, forward head  TREATMENT DATE: 07/18/23  Prone press ups, prone lying and POE all make sx worse Hooklying - a little less, DKTC 3 x 10 consistently decreases sx DKTC - hold abolishes numbness Functional squat with red cord x 10 - to simulate helping husband with briefs in functional squat position. When assessing gait - numbness returns and pt starts to limp.   Trigger Point Dry Needling  Subsequent Treatment: Instructions provided previously at initial dry needling treatment.   Patient Verbal Consent Given: Yes Education Handout Provided: Yes Muscles Treated: right gluteals, right piriformis Electrical Stimulation Performed: Yes, Parameters: 1.5 ma 80 pps 6 min 2 channels Treatment Response/Outcome: decreased pain Utilized skilled palpation to identify bony landmarks and trigger points.  Able to illicit twitch response and muscle elongation.     TREATMENT DATE: 07/13/23  Prone press ups 5x small range, 5x full range  Sidelying clam painful 8x Supine  bent knee raise with transverse ab draw in 10x (added to HEP) Supine hand to knee push 5 sec hold 8x right/left (added to HEP) Supine red band isometric clam push (no pain) (added to HEP) Trigger Point Dry Needling  Initial Treatment: Pt instructed on Dry Needling rational, procedures, and possible side effects. Pt instructed to expect mild to moderate muscle soreness later in the day and/or into the next day.  Pt instructed in methods to reduce muscle soreness. Pt instructed to continue prescribed HEP. Patient verbalized understanding of these instructions and education.   Patient Verbal Consent Given: Yes Education Handout Provided: Yes Muscles Treated: right only lumbar multifidi; right gluteals, right piriformis, right proximal HS Electrical Stimulation Performed: Yes, Parameters: 1.5 ma 80 pps 8 min 2 channels Treatment Response/Outcome: decreased pain   TREATMENT DATE: 07/05/23  Sleep with pillow b/w knees Avoid crossing legs when sitting Stoplight system (peripheralization and centralization) Trial of extension ex Pt to try her home TENs Discussed OK on UE ex at the gym                                                                                                                                PATIENT EDUCATION:  Education details: Educated patient on anatomy and physiology of current symptoms, prognosis,  plan of care as well as initial self care strategies to promote recovery Person educated: Patient Education method: Explanation Education comprehension: verbalized understanding  HOME EXERCISE PROGRAM: Access Code: [email or web address] URL: [email or web address] Date: 07/18/2023 Prepared by: Concha Deed  Exercises - Lying Prone  - 1 x daily - 7 x weekly - 1 sets - 10 reps - Prone Press Up On Elbows  - 1 x daily - 7 x weekly - 1 sets - 5 reps - Prone Press Up  - 1 x daily - 7 x weekly - 1 sets - 5-10 reps - Supine March  - 1 x daily - 7 x weekly - 1 sets - 8-10 reps -  Hooklying Isometric Hip Flexion with Opposite Arm  - 1 x daily - 7 x weekly - 1 sets - 8-10 reps - 5 hold - Hooklying Clamshell with Resistance  - 1 x daily - 7 x weekly - 1 sets - 8-10 reps - 5 hold - Supine Double Knee to Chest  - 2 x daily - 7 x weekly - 1 sets - 1-10 reps - 0-20 sec hold  ASSESSMENT:  CLINICAL IMPRESSION: Laprecious reports increased pain since yesterday. She was on her feet a lot, but wasn't doing anything out of the ordinary. All ext exercises made things worse. Repeated flexion abolixhed all sx today. Pain returned when she stood to have balance assessed. We held balance testing secondary to this,. Flexion again decreased sx. We then proceeded with another round of DN with excellent response to tissues. Patient reported no sx post DN at end of session. She will benefit from balance assessment at next session as she reports increased unsteadiness when she initiates gait.  OBJECTIVE IMPAIRMENTS: decreased activity tolerance, difficulty walking, decreased ROM, decreased strength, impaired perceived functional ability, increased muscle spasms, impaired flexibility, postural dysfunction, and pain.   ACTIVITY LIMITATIONS: lifting, sitting, standing, sleeping, locomotion level, and caring for others  PARTICIPATION LIMITATIONS: meal prep, cleaning, laundry, interpersonal relationship, and community activity  PERSONAL FACTORS: 1-2 comorbidities: HTN, osteopenia  are also affecting patient's functional outcome.   REHAB POTENTIAL: Good  CLINICAL DECISION MAKING: Stable/uncomplicated  EVALUATION COMPLEXITY: Low   GOALS: Goals reviewed with patient? Yes  SHORT TERM GOALS: Target date: 08/02/2023    The patient will demonstrate knowledge of basic self care strategies and exercises to promote healing  Baseline: Goal status: INITIAL  2.  The patient will report a 30% improvement in pain levels with functional activities which are currently difficult including walking,  sitting Baseline:  Goal status: INITIAL  3.  The patient will have improved gait stamina and speed needed to ambulate 800 feet in 6 minutes  Baseline:  Goal status: INITIAL  4.  5x STS improved to 13 sec indicating improved LE strength Baseline:  Goal status: INITIAL    LONG TERM GOALS: Target date: 08/30/2023   The patient will be independent in a safe self progression of a home exercise program to promote further recovery of function  Baseline:  Goal status: INITIAL  2.  The patient will report a 70% improvement in pain levels with functional activities which are currently difficult including walking, standing, sitting Baseline:  Goal status: INITIAL  3.  The patient will be able to walk 1/4 to 1/2 mile with pain level < 5/10 Baseline:  Goal status: INITIAL  4.  The patient will have improved trunk flexor and extensor muscle strength to at least 4+/5 needed for lifting medium weight objects such as bird seed and deer  corn bags Baseline:  Goal status: INITIAL  5.  Modified Oswestry score improved to 32% indicating improved function with less pain Baseline:  Goal status: INITIAL   PLAN:  PT FREQUENCY: 2x/week  PT DURATION: 8 weeks  PLANNED INTERVENTIONS: 97164- PT Re-evaluation, 97110-Therapeutic exercises, 97530- Therapeutic activity, 97112- Neuromuscular re-education, 97535- Self Care, 16109- Manual therapy, [id number]- Aquatic Therapy, 97014- Electrical stimulation (unattended), [id number]- Electrical stimulation (manual), [id number]- Ultrasound, [id number]- Traction (mechanical), [id number]- Ionotophoresis 4mg /ml Dexamethasone, Patient/Family education, Taping, Dry Needling, Joint mobilization, Spinal manipulation, Spinal mobilization, Cryotherapy, and Moist heat.  PLAN FOR NEXT SESSION: assess response to flexion trial when LE symptoms are present; assess response to DN with ES to right gluteals and piriformis and ESI. Check balance to determine if true balance of MSK problem.  Jinx Mourning, PT  07/18/23 11:02 AM Phone: [phone] Fax: [phone]

## 2023-07-18 ENCOUNTER — Encounter: Payer: Self-pay | Admitting: Physical Therapy

## 2023-07-18 ENCOUNTER — Ambulatory Visit: Payer: Medicare PPO | Admitting: Physical Therapy

## 2023-07-18 DIAGNOSIS — M5441 Lumbago with sciatica, right side: Secondary | ICD-10-CM | POA: Diagnosis not present

## 2023-07-18 DIAGNOSIS — M6281 Muscle weakness (generalized): Secondary | ICD-10-CM | POA: Diagnosis not present

## 2023-07-18 DIAGNOSIS — G8929 Other chronic pain: Secondary | ICD-10-CM

## 2023-07-20 NOTE — Discharge Instructions (Signed)

## 2023-07-21 ENCOUNTER — Ambulatory Visit
Admission: RE | Admit: 2023-07-21 | Discharge: 2023-07-21 | Disposition: A | Discharge status: Home / Self Care | Payer: Medicare PPO | Source: Ambulatory Visit | Attending: Sports Medicine | Admitting: Sports Medicine

## 2023-07-21 DIAGNOSIS — G8929 Other chronic pain: Secondary | ICD-10-CM

## 2023-07-21 DIAGNOSIS — M4727 Other spondylosis with radiculopathy, lumbosacral region: Secondary | ICD-10-CM | POA: Diagnosis not present

## 2023-07-21 DIAGNOSIS — M48061 Spinal stenosis, lumbar region without neurogenic claudication: Secondary | ICD-10-CM | POA: Diagnosis not present

## 2023-07-21 DIAGNOSIS — M51362 Other intervertebral disc degeneration, lumbar region with discogenic back pain and lower extremity pain: Secondary | ICD-10-CM

## 2023-07-21 MED ORDER — METHYLPREDNISOLONE ACETATE 40 MG/ML INJ SUSP (RADIOLOG
80.0000 mg | Freq: Once | INTRAMUSCULAR | Status: AC
  Administered 2023-07-21: 80 mg via EPIDURAL

## 2023-07-21 MED ORDER — IOPAMIDOL (ISOVUE-M 200) INJECTION 41%
10.0000 mL | Freq: Once | INTRAMUSCULAR | Status: AC
  Administered 2023-07-21: 1 mL via EPIDURAL

## 2023-07-24 NOTE — Therapy (Signed)
 OUTPATIENT PHYSICAL THERAPY THORACOLUMBAR PROGRESS NOTE   Patient Name: Kristi Alvarado MRN: [id number] DOB:Jun 27, 1950, 73 y.o., female Today's Date: 07/25/2023  END OF SESSION:  PT End of Session - 07/25/23 1236     Visit Number 4    Number of Visits 16    Date for PT Re-Evaluation 08/30/23    Authorization Type [employer] 16  1/28-3/25    PT Start Time 1236    PT Stop Time 1319    PT Time Calculation (min) 43 min    Activity Tolerance Patient tolerated treatment well               Past Medical History:  Diagnosis Date   Allergy    Hypertension    Past Surgical History:  Procedure Laterality Date   BREAST BIOPSY Right    removal benign adenoma right breast   BTL     COLONOSCOPY  10 yrs ago    [facility]"   Patient Active Problem List   Diagnosis Date Noted   Primary hypertension 04/18/2023   Hyperlipidemia 10/26/2022   Routine general medical examination at a health care facility 10/31/2014    PCP: Hillard Danker MD  REFERRING PROVIDER: Richardean Sale DO  REFERRING DIAG: N82.95, G89.29 chronic 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 Still have the numbness. The flexion helps abolish the numbness.     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 Chronic bilateral low back pain with right-sided sciatica 2. Degeneration of intervertebral disc of lumbar region with discogenic back pain and lower extremity pain 3. Greater trochanteric bursitis of right hip 4. Muscle strain of  right gluteal region, subsequent encounter  -Chronic with exacerbation, complicated, subsequent visit - Complicated presentation that is likely multifactorial including lumbar stenosis with radiculopathy and greater trochanteric bursitis/gluteal tendinitis.  Patient continues to experience right posterior hip pain.  Patient did have significant improvement for 4 weeks after greater trochanteric/gluteal tendon insertion CSI performed on 05/17/2023, however all pain returned restarting physical activity.  Discussed that we hope for 3 months or greater relief from CSI - Recommend epidural right sided L4-L5 to evaluate if lumbar stenosis is a primary contributing factor to patient's pain.  Order placed - Patient elected for IM injection of methylprednisone 80 mg/Toradol 60 mg.  Injection given in clinic today and tolerated well. PERTINENT HISTORY:  HTN; mild high BP  PAIN:  Are you having pain? Yes NPRS scale:2-3/10 pain in the R hip, more numbness (see below) Pain location: tender right low back; numbness right post/lateral and anteriorthigh, right anterior shin spasms, tingling and numbness constant Pain orientation: Right  PAIN TYPE: burning and tingling Pain description: constant and tingling  Aggravating factors: walk fast; lying on left side; sitting Relieving factors: after meds for 2 hours; right sidelying; take some weight off right leg; tested on Sunday and walked 1/4 mile felt  better   PRECAUTIONS: None   WEIGHT BEARING RESTRICTIONS: No  FALLS:  Has patient fallen in last 6 months? No  LIVING ENVIRONMENT: Lives with: lives with their spouse Lives in: House/apartment [address] and older community  Stairs: No  OCCUPATION: retired  Human resources officer: running;   PLOF: Independent takes care of husband (Parkinsons) tries to limit lifting PATIENT GOALS: walk without pain, walk long distances, later return to running; be able to lift 40# bag of birdseed or 50# bag of deer corn (drags)   NEXT MD VISIT: next Tuesday 2/4 OBJECTIVE:  Note: Objective measures were completed at Evaluation unless otherwise noted.  DIAGNOSTIC FINDINGS:  MRI December 2024 IMPRESSION: 1. Severe L4-L5 facet arthrosis with moderate spinal canal stenosis and severe right neural foraminal stenosis. Round, low signal structure extending into the right neural foramen may be a sequestered disc fragment or synovial cyst. 2. Mild L3-L4 spinal canal stenosis.  Bone Density:  mild osteopenia femur? (not in Epic pt to look up findings)  PATIENT SURVEYS:  Modified Oswestry 44%   COGNITION: Overall cognitive status: Within functional limits for tasks assessed     SENSATION: Light touch: Impaired right thigh and anterior lower leg  MUSCLE LENGTH: Decreased right hip flexor length in sidelying and prone POSTURE: decreased lumbar lordosis and right iliac crest higher than left; moderate to severe left ankle pronation  PALPATION: Marked tenderness and spasm in right gluteals and piriformis  LUMBAR ROM:  DKTC mild increase but no worse ; prone lying in hip/buttock ; POE less pain; press up less tingling in leg?   AROM eval  Flexion 25% limited  Extension 25% limited  Right lateral flexion 10% limited  Left lateral flexion 10% limited  Right rotation   Left rotation    (Blank rows = not tested)  TRUNK STRENGTH:  Decreased activation of transverse abdominus muscles; abdominals 4-/5; decreased  activation of lumbar multifidi; trunk extensors 4-/5  LOWER EXTREMITY ROM:   20% reduction in right hip mobility LOWER EXTREMITY MMT:  Able to rise from standard chair without UE assist  MMT Right eval Left eval  Hip flexion 4 5  Hip extension 4 5  Hip abduction 3+ 5  Hip adduction    Hip internal rotation    Hip  external rotation 4 5  Knee flexion 4 5  Knee extension 4 5  Ankle dorsiflexion 3+ 3+  Ankle plantarflexion 3+ 3+  Ankle inversion    Ankle eversion     (Blank rows = not tested)  LUMBAR SPECIAL TESTS:  Negative slump and SLR but + neural tension   FUNCTIONAL TESTS:  SLS 2 sec right/left 5x STS 15.18 sec no UE use 3 MWT 443 feet  pain 8/10 following, increased numbness/tingling  2/17 TUG 8.07 sec  DGI 23/24  EC x 10 sec no problem   GAIT: Comments: decreased gait speed, increased forward trunk flexion, forward head     TREATMENT DATE: 07/18/23  Nustep L5 x 6 min PT present to discuss status DGI 23/24, TUG completed Rows GTB 2x10 with TA contraction one set with semi tandem stance Ext GTB 2x10 with TA contraction Pallof press red x 10 B (did with green first but too challeging) Functional squat with red cord x 10 - to simulate helping husband with briefs in functional squat position. Dead lift with red cord x 10 Chair push with 25# x 40 feet, with 50# x 40 ft Resisted walking fwd/bwd 10# x 5 ea Standing hip ABD 2x10 B Standing hip ext x 10 increases numbness so held exercise Seated KTC relieves numbness   TREATMENT DATE: 07/18/23  Prone press ups, prone lying and POE all make sx worse Hooklying - a little less, DKTC 3 x 10 consistently decreases sx DKTC - hold abolishes numbness Functional squat with red cord x 10 - to simulate helping husband with briefs in functional squat position. When assessing gait - numbness returns and pt starts to limp.   Trigger Point Dry Needling  Subsequent Treatment: Instructions provided previously at initial dry  needling treatment.   Patient Verbal Consent Given: Yes Education Handout Provided: Yes Muscles Treated: right gluteals, right piriformis Electrical Stimulation Performed: Yes, Parameters: 1.5 ma 80 pps 6 min 2 channels Treatment Response/Outcome: decreased pain Utilized skilled palpation to identify bony landmarks and trigger points.  Able to illicit twitch response and muscle elongation.     TREATMENT DATE: 07/13/23  Prone press ups 5x small range, 5x full range  Sidelying clam painful 8x Supine bent knee raise with transverse ab draw in 10x (added to HEP) Supine hand to knee push 5 sec hold 8x right/left (added to HEP) Supine red band isometric clam push (no pain) (added to HEP) Trigger Point Dry Needling  Initial Treatment: Pt instructed on Dry Needling rational, procedures, and possible side effects. Pt instructed to expect mild to moderate muscle soreness later in the day and/or into the next day.  Pt instructed in methods to reduce muscle soreness. Pt instructed to continue prescribed HEP. Patient verbalized understanding of these instructions and education.   Patient Verbal Consent Given: Yes Education Handout Provided: Yes Muscles Treated: right only lumbar multifidi; right gluteals, right piriformis, right proximal HS Electrical Stimulation Performed: Yes, Parameters: 1.5 ma 80 pps 8 min 2 channels Treatment Response/Outcome: decreased pain   TREATMENT DATE: 07/05/23  Sleep with pillow b/w knees Avoid crossing legs when sitting Stoplight system (peripheralization and centralization) Trial of extension ex Pt to try her home TENs Discussed OK on UE ex at the gym  PATIENT EDUCATION:  Education details: Educated patient on anatomy and physiology of current symptoms, prognosis, plan of care as well as initial self care strategies to promote  recovery Person educated: Patient Education method: Explanation Education comprehension: verbalized understanding  HOME EXERCISE PROGRAM: Access Code: [email or web address] URL: [email or web address] Date: 07/25/2023 Prepared by: Raynelle Fanning  Exercises - Lying Prone  - 1 x daily - 7 x weekly - 1 sets - 10 reps - Prone Press Up On Elbows  - 1 x daily - 7 x weekly - 1 sets - 5 reps - Prone Press Up  - 1 x daily - 7 x weekly - 1 sets - 5-10 reps - Supine March  - 1 x daily - 7 x weekly - 1 sets - 8-10 reps - Hooklying Isometric Hip Flexion with Opposite Arm  - 1 x daily - 7 x weekly - 1 sets - 8-10 reps - 5 hold - Hooklying Clamshell with Resistance  - 1 x daily - 7 x weekly - 1 sets - 8-10 reps - 5 hold - Supine Double Knee to Chest  - 2 x daily - 7 x weekly - 1 sets - 1-10 reps - 0-20 sec hold - Standing Hip Abduction with Counter Support  - 1 x daily - 3 x weekly - 2 sets - 10 reps  ASSESSMENT:  CLINICAL IMPRESSION: Khadijah reports relief from her epidural last Thursday. We did a quick balance assessment using TUG and DGI and it appears her unsteadiness may be more related to orthostatic hypertension. We progressed core strengthening today as tolerated. Hip extension brings on numbness, so this exercise was stopped. Patient reports difficulty with pushing husband in his w/c, so we simulated this with chair pushes. Neftali continues to demonstrate potential for improvement and would benefit from continued skilled therapy to address impairments.    OBJECTIVE IMPAIRMENTS: decreased activity tolerance, difficulty walking, decreased ROM, decreased strength, impaired perceived functional ability, increased muscle spasms, impaired flexibility, postural dysfunction, and pain.   ACTIVITY LIMITATIONS: lifting, sitting, standing, sleeping, locomotion level, and caring for others  PARTICIPATION LIMITATIONS: meal prep, cleaning, laundry, interpersonal relationship, and community activity  PERSONAL  FACTORS: 1-2 comorbidities: HTN, osteopenia  are also affecting patient's functional outcome.   REHAB POTENTIAL: Good  CLINICAL DECISION MAKING: Stable/uncomplicated  EVALUATION COMPLEXITY: Low   GOALS: Goals reviewed with patient? Yes  SHORT TERM GOALS: Target date: 08/02/2023    The patient will demonstrate knowledge of basic self care strategies and exercises to promote healing  Baseline: Goal status: INITIAL  2.  The patient will report a 30% improvement in pain levels with functional activities which are currently difficult including walking, sitting Baseline:  Goal status: INITIAL  3.  The patient will have improved gait stamina and speed needed to ambulate 800 feet in 6 minutes  Baseline:  Goal status: INITIAL  4.  5x STS improved to 13 sec indicating improved LE strength Baseline:  Goal status: INITIAL    LONG TERM GOALS: Target date: 08/30/2023   The patient will be independent in a safe self progression of a home exercise program to promote further recovery of function  Baseline:  Goal status: INITIAL  2.  The patient will report a 70% improvement in pain levels with functional activities which are currently difficult including walking, standing, sitting Baseline:  Goal status: INITIAL  3.  The patient will be able to walk 1/4 to 1/2 mile with pain level < 5/10 Baseline:  Goal status: INITIAL  4.  The patient  will have improved trunk flexor and extensor muscle strength to at least 4+/5 needed for lifting medium weight objects such as bird seed and deer corn bags Baseline:  Goal status: INITIAL  5.  Modified Oswestry score improved to 32% indicating improved function with less pain Baseline:  Goal status: INITIAL   PLAN:  PT FREQUENCY: 2x/week  PT DURATION: 8 weeks  PLANNED INTERVENTIONS: 97164- PT Re-evaluation, 97110-Therapeutic exercises, 97530- Therapeutic activity, 97112- Neuromuscular re-education, 97535- Self Care, 40981- Manual therapy,  [id number]- Aquatic Therapy, 97014- Electrical stimulation (unattended), [id number]- Electrical stimulation (manual), [id number]- Ultrasound, [id number]- Traction (mechanical), [id number]- Ionotophoresis 4mg /ml Dexamethasone, Patient/Family education, Taping, Dry Needling, Joint mobilization, Spinal manipulation, Spinal mobilization, Cryotherapy, and Moist heat.  PLAN FOR NEXT SESSION: continue core strength monitoring numbness sx and staying more flexion biased.  Solon Palm, PT  07/25/23 1:34 PM Phone: [phone] Fax: [phone]

## 2023-07-25 ENCOUNTER — Encounter: Payer: Self-pay | Admitting: Physical Therapy

## 2023-07-25 ENCOUNTER — Ambulatory Visit: Payer: Medicare PPO | Admitting: Physical Therapy

## 2023-07-25 DIAGNOSIS — M6281 Muscle weakness (generalized): Secondary | ICD-10-CM | POA: Diagnosis not present

## 2023-07-25 DIAGNOSIS — G8929 Other chronic pain: Secondary | ICD-10-CM

## 2023-07-25 DIAGNOSIS — M5441 Lumbago with sciatica, right side: Secondary | ICD-10-CM | POA: Diagnosis not present

## 2023-07-28 ENCOUNTER — Ambulatory Visit: Payer: Medicare PPO | Admitting: Physical Therapy

## 2023-08-01 ENCOUNTER — Ambulatory Visit: Payer: Medicare PPO

## 2023-08-01 DIAGNOSIS — G8929 Other chronic pain: Secondary | ICD-10-CM

## 2023-08-01 DIAGNOSIS — M6281 Muscle weakness (generalized): Secondary | ICD-10-CM | POA: Diagnosis not present

## 2023-08-01 DIAGNOSIS — M5441 Lumbago with sciatica, right side: Secondary | ICD-10-CM | POA: Diagnosis not present

## 2023-08-01 NOTE — Progress Notes (Unsigned)
    Kristi Alvarado D.[facility] [address] [place] [address] Phone: [phone]   Assessment and Plan:     There are no diagnoses linked to this encounter.  ***   Pertinent previous records reviewed include ***    Follow Up: ***     Subjective:   I, Kristi Alvarado, am serving as a Neurosurgeon for Doctor Richardean Sale   Chief Complaint: low back and right hip pain    HPI:    04/28/2023 Patient is  a 73 year old female with concerns of low back and right hip pain. Patient states that she has had some improvement with prednisone. Decreased ROM due to pain. Pain for about 4 weeks. Pain radiates down to the right hip . Has received CSI and that helped but she is still very tender   05/17/2023 Patient states that she is not doing well. She is in a lot of pain    05/27/2023 Patient states she is much better    06/20/2023 Patient states pain came back yesterday no MOI just had acute pain . Tylenol is not touching the pain    07/12/2023 Patient states that she is doing much better   08/02/2023 Patient states   Relevant Historical Information: Hypertension    Additional pertinent review of systems negative.   Current Outpatient Medications:    amLODipine (NORVASC) 2.5 MG tablet, Take 1 tablet (2.5 mg total) by mouth daily., Disp: 90 tablet, Rfl: 2   BIOTIN 5000 PO, Take 1 tablet by mouth daily., Disp: , Rfl:    clonazePAM (KLONOPIN) 0.5 MG tablet, Take 0.5 mg by mouth at bedtime as needed., Disp: , Rfl:    fexofenadine (ALLEGRA) 180 MG tablet, Take 1 tablet by mouth daily., Disp: , Rfl:    fluticasone (FLONASE) 50 MCG/ACT nasal spray, Place 1 spray into the nose daily., Disp: , Rfl:    glucosamine-chondroitin 500-400 MG tablet, Take 1 tablet by mouth 3 (three) times daily., Disp: , Rfl:    meloxicam (MOBIC) 15 MG tablet, Take 1 tablet (15 mg total) by mouth daily., Disp: 30 tablet, Rfl: 0   methocarbamol (ROBAXIN) 500 MG tablet, Take 1  tablet (500 mg total) by mouth every 8 (eight) hours as needed for muscle spasms., Disp: 30 tablet, Rfl: 0   NON FORMULARY, Take 1-3 capsules by mouth 2 (two) times daily. Patient takes 2 capsules in the morning and 1 at night, Disp: , Rfl:    PRESCRIPTION MEDICATION, Regenereyes 1drop left eye twice daily, Disp: , Rfl:    sertraline (ZOLOFT) 50 MG tablet, sertraline 50 mg tablet  TAKE 1 TABLET BY MOUTH EVERY DAY FOR 30 DAYS, Disp: , Rfl:    Objective:     There were no vitals filed for this visit.    There is no height or weight on file to calculate BMI.    Physical Exam:    ***   Electronically signed by:  Kristi Alvarado D.[facility] 12:30 PM 08/01/23

## 2023-08-01 NOTE — Therapy (Signed)
 OUTPATIENT PHYSICAL THERAPY THORACOLUMBAR PROGRESS NOTE   Patient Name: Kristi Alvarado MRN: [id number] DOB:27-Dec-1950, 73 y.o., female Today's Date: 08/01/2023  END OF SESSION:  PT End of Session - 08/01/23 1230     Visit Number 5    Date for PT Re-Evaluation 08/30/23    Authorization Type [employer] 16  1/28-3/25    Authorization - Visit Number 5    Authorization - Number of Visits 16    PT Start Time 1146    PT Stop Time 1229    PT Time Calculation (min) 43 min    Activity Tolerance Patient tolerated treatment well    Behavior During Therapy WFL for tasks assessed/performed                Past Medical History:  Diagnosis Date   Allergy    Hypertension    Past Surgical History:  Procedure Laterality Date   BREAST BIOPSY Right    removal benign adenoma right breast   BTL     COLONOSCOPY  10 yrs ago    [place] [facility] exam"   Patient Active Problem List   Diagnosis Date Noted   Primary hypertension 04/18/2023   Hyperlipidemia 10/26/2022   Routine general medical examination at a health care facility 10/31/2014    PCP: Hillard Danker MD  REFERRING PROVIDER: Richardean Sale DO  REFERRING DIAG: W09.81, G89.29 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 I am going to try and walk later.     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 Chronic bilateral low back pain with right-sided sciatica 2. Degeneration of intervertebral disc of lumbar region with discogenic back  pain and lower extremity pain 3. Greater trochanteric bursitis of right hip 4. Muscle strain of right gluteal region, subsequent encounter  -Chronic with exacerbation, complicated, subsequent visit - Complicated presentation that is likely multifactorial including lumbar stenosis with radiculopathy and greater trochanteric bursitis/gluteal tendinitis.  Patient continues to experience right posterior hip pain.  Patient did have significant improvement for 4 weeks after greater trochanteric/gluteal tendon insertion CSI performed on 05/17/2023, however all pain returned restarting physical activity.  Discussed that we hope for 3 months or greater relief from CSI - Recommend epidural right sided L4-L5 to evaluate if lumbar stenosis is a primary contributing factor to patient's pain.  Order placed - Patient elected for IM injection of methylprednisone 80 mg/Toradol 60 mg.  Injection given in clinic today and tolerated well. PERTINENT HISTORY:  HTN; mild high BP  PAIN: 08/01/23 Are you having pain? Yes NPRS scale:2-3/10 pain in the R hip, more numbness (see below) Pain location: tender right low back; numbness right post/lateral and anteriorthigh, right anterior shin spasms, tingling and numbness constant Pain orientation: Right  PAIN TYPE: burning and tingling Pain description: constant and tingling  Aggravating factors: walk fast; lying on left side; sitting Relieving factors: after meds for 2 hours; right sidelying; take some weight off  right leg; tested on Sunday and walked 1/4 mile felt better   PRECAUTIONS: None   WEIGHT BEARING RESTRICTIONS: No  FALLS:  Has patient fallen in last 6 months? No  LIVING ENVIRONMENT: Lives with: lives with their spouse Lives in: House/apartment [address] community  Stairs: No  OCCUPATION: retired  Human resources officer: running;   PLOF: Independent takes care of husband (Parkinsons) tries to limit lifting PATIENT GOALS: walk without pain, walk long distances, later return to running; be able to lift 40# bag of birdseed or 50# bag of deer corn (drags)   NEXT MD VISIT: next Tuesday 2/4 OBJECTIVE:  Note: Objective measures were completed at Evaluation unless otherwise noted.  DIAGNOSTIC FINDINGS:  MRI December 2024 IMPRESSION: 1. Severe L4-L5 facet arthrosis with moderate spinal canal stenosis and severe right neural foraminal stenosis. Round, low signal structure extending into the right neural foramen may be a sequestered disc fragment or synovial cyst. 2. Mild L3-L4 spinal canal stenosis.  Bone Density:  mild osteopenia femur? (not in Epic pt to look up findings)  PATIENT SURVEYS:  Modified Oswestry 44%   COGNITION: Overall cognitive status: Within functional limits for tasks assessed     SENSATION: Light touch: Impaired right thigh and anterior lower leg  MUSCLE LENGTH: Decreased right hip flexor length in sidelying and prone POSTURE: decreased lumbar lordosis and right iliac crest higher than left; moderate to severe left ankle pronation  PALPATION: Marked tenderness and spasm in right gluteals and piriformis  LUMBAR ROM:  DKTC mild increase but no worse ; prone lying in hip/buttock ; POE less pain; press up less tingling in leg?   AROM eval  Flexion 25% limited  Extension 25% limited  Right lateral flexion 10% limited  Left lateral flexion 10% limited  Right rotation   Left rotation    (Blank rows = not tested)  TRUNK STRENGTH:  Decreased activation of  transverse abdominus muscles; abdominals 4-/5; decreased activation of lumbar multifidi; trunk extensors 4-/5  LOWER EXTREMITY ROM:   20% reduction in right hip mobility LOWER EXTREMITY MMT:  Able to rise from standard chair without UE assist  MMT Right eval Left eval  Hip flexion 4 5  Hip extension 4 5  Hip  abduction 3+ 5  Hip adduction    Hip internal rotation    Hip external rotation 4 5  Knee flexion 4 5  Knee extension 4 5  Ankle dorsiflexion 3+ 3+  Ankle plantarflexion 3+ 3+  Ankle inversion    Ankle eversion     (Blank rows = not tested)  LUMBAR SPECIAL TESTS:  Negative slump and SLR but + neural tension   FUNCTIONAL TESTS:  SLS 2 sec right/left 5x STS 15.18 sec no UE use 3 MWT 443 feet  pain 8/10 following, increased numbness/tingling  2/17 TUG 8.07 sec  DGI 23/24  EC x 10 sec no problem   GAIT: Comments: decreased gait speed, increased forward trunk flexion, forward head   TREATMENT DATE: 08/01/23  Nustep L5 x 7 min PT present to discuss status Sit to stand: 5# kettlebell 2x10 Seated hamstring 2x20 seconds  Forward ball rolls: x8 with 5" hold Rows GTB 2x10 with TA contraction with semi tandem stance Shoulder extension GTB 2x10 with TA contraction Pallof press red x 10 B Resisted walking fwd/bwd 10# x 10 ea Standing hip ABD 2x10 bil- standing on balance pad  TREATMENT DATE: 07/18/23  Nustep L5 x 6 min PT present to discuss status DGI 23/24, TUG completed Rows GTB 2x10 with TA contraction one set with semi tandem stance Ext GTB 2x10 with TA contraction Pallof press red x 10 B (did with green first but too challeging) Functional squat with red cord x 10 - to simulate helping husband with briefs in functional squat position. Dead lift with red cord x 10 Chair push with 25# x 40 feet, with 50# x 40 ft Resisted walking fwd/bwd 10# x 5 ea Standing hip ABD 2x10 B Standing hip ext x 10 increases numbness so held exercise Seated KTC relieves  numbness   TREATMENT DATE: 07/18/23  Prone press ups, prone lying and POE all make sx worse Hooklying - a little less, DKTC 3 x 10 consistently decreases sx DKTC - hold abolishes numbness Functional squat with red cord x 10 - to simulate helping husband with briefs in functional squat position. When assessing gait - numbness returns and pt starts to limp.   Trigger Point Dry Needling  Subsequent Treatment: Instructions provided previously at initial dry needling treatment.   Patient Verbal Consent Given: Yes Education Handout Provided: Yes Muscles Treated: right gluteals, right piriformis Electrical Stimulation Performed: Yes, Parameters: 1.5 ma 80 pps 6 min 2 channels Treatment Response/Outcome: decreased pain Utilized skilled palpation to identify bony landmarks and trigger points.  Able to illicit twitch response and muscle elongation.                                  PATIENT EDUCATION:  Education details: Educated patient on anatomy and physiology of current symptoms, prognosis, plan of care as well as initial self care strategies to promote recovery Person educated: Patient Education method: Explanation Education comprehension: verbalized understanding  HOME EXERCISE PROGRAM: Access Code: [email or web address] URL: [email or web address] Date: 07/25/2023 Prepared by: Raynelle Fanning  Exercises - Lying Prone  - 1 x daily - 7 x weekly - 1 sets - 10 reps - Prone Press Up On Elbows  - 1 x daily - 7 x weekly - 1 sets - 5 reps - Prone Press Up  - 1 x daily - 7 x weekly - 1 sets - 5-10 reps - Supine March  - 1 x daily - 7 x  weekly - 1 sets - 8-10 reps - Hooklying Isometric Hip Flexion with Opposite Arm  - 1 x daily - 7 x weekly - 1 sets - 8-10 reps - 5 hold - Hooklying Clamshell with Resistance  - 1 x daily - 7 x weekly - 1 sets - 8-10 reps - 5 hold - Supine Double Knee to Chest  - 2 x daily - 7 x weekly - 1 sets - 1-10 reps - 0-20 sec hold - Standing Hip Abduction with Counter Support   - 1 x daily - 3 x weekly - 2 sets - 10 reps  ASSESSMENT:  CLINICAL IMPRESSION: Pt reports that her recent epidural has helped.  Pt tolerated all exercise well today with a few advancements. PT monitored throughout session for pain, technique and to advance as needed.  Hudsyn continues to demonstrate potential for improvement and would benefit from continued skilled therapy to address impairments.    OBJECTIVE IMPAIRMENTS: decreased activity tolerance, difficulty walking, decreased ROM, decreased strength, impaired perceived functional ability, increased muscle spasms, impaired flexibility, postural dysfunction, and pain.   ACTIVITY LIMITATIONS: lifting, sitting, standing, sleeping, locomotion level, and caring for others  PARTICIPATION LIMITATIONS: meal prep, cleaning, laundry, interpersonal relationship, and community activity  PERSONAL FACTORS: 1-2 comorbidities: HTN, osteopenia  are also affecting patient's functional outcome.   REHAB POTENTIAL: Good  CLINICAL DECISION MAKING: Stable/uncomplicated  EVALUATION COMPLEXITY: Low   GOALS: Goals reviewed with patient? Yes  SHORT TERM GOALS: Target date: 08/02/2023    The patient will demonstrate knowledge of basic self care strategies and exercises to promote healing  Baseline: Goal status: INITIAL  2.  The patient will report a 30% improvement in pain levels with functional activities which are currently difficult including walking, sitting Baseline:  Goal status: INITIAL  3.  The patient will have improved gait stamina and speed needed to ambulate 800 feet in 6 minutes  Baseline:  Goal status: INITIAL  4.  5x STS improved to 13 sec indicating improved LE strength Baseline:  Goal status: INITIAL    LONG TERM GOALS: Target date: 08/30/2023   The patient will be independent in a safe self progression of a home exercise program to promote further recovery of function  Baseline:  Goal status: INITIAL  2.  The patient  will report a 70% improvement in pain levels with functional activities which are currently difficult including walking, standing, sitting Baseline:  Goal status: INITIAL  3.  The patient will be able to walk 1/4 to 1/2 mile with pain level < 5/10 Baseline:  Goal status: INITIAL  4.  The patient will have improved trunk flexor and extensor muscle strength to at least 4+/5 needed for lifting medium weight objects such as bird seed and deer corn bags Baseline:  Goal status: INITIAL  5.  Modified Oswestry score improved to 32% indicating improved function with less pain Baseline:  Goal status: INITIAL   PLAN:  PT FREQUENCY: 2x/week  PT DURATION: 8 weeks  PLANNED INTERVENTIONS: 97164- PT Re-evaluation, 97110-Therapeutic exercises, 97530- Therapeutic activity, 97112- Neuromuscular re-education, 97535- Self Care, 16109- Manual therapy, [id number]- Aquatic Therapy, 97014- Electrical stimulation (unattended), [id number]- Electrical stimulation (manual), [id number]- Ultrasound, [id number]- Traction (mechanical), [id number]- Ionotophoresis 4mg /ml Dexamethasone, Patient/Family education, Taping, Dry Needling, Joint mobilization, Spinal manipulation, Spinal mobilization, Cryotherapy, and Moist heat.  PLAN FOR NEXT SESSION: continue core strength monitoring numbness sx and staying more flexion biased.  Lorrene Reid, PT 08/01/23 12:32 PM  Phone: [phone] Fax: [phone]

## 2023-08-02 ENCOUNTER — Ambulatory Visit: Payer: Medicare PPO | Admitting: Sports Medicine

## 2023-08-02 VITALS — BP 138/80 | Ht 64.0 in | Wt 137.0 lb

## 2023-08-02 DIAGNOSIS — M51362 Other intervertebral disc degeneration, lumbar region with discogenic back pain and lower extremity pain: Secondary | ICD-10-CM

## 2023-08-02 DIAGNOSIS — G8929 Other chronic pain: Secondary | ICD-10-CM

## 2023-08-03 ENCOUNTER — Ambulatory Visit: Payer: Medicare PPO | Admitting: Physical Therapy

## 2023-08-03 DIAGNOSIS — G8929 Other chronic pain: Secondary | ICD-10-CM | POA: Diagnosis not present

## 2023-08-03 DIAGNOSIS — M6281 Muscle weakness (generalized): Secondary | ICD-10-CM | POA: Diagnosis not present

## 2023-08-03 DIAGNOSIS — M5441 Lumbago with sciatica, right side: Secondary | ICD-10-CM | POA: Diagnosis not present

## 2023-08-03 NOTE — Therapy (Signed)
 OUTPATIENT PHYSICAL THERAPY THORACOLUMBAR PROGRESS NOTE   Patient Name: Kristi Alvarado MRN: [id number] DOB:[birth year], 73 y.o., female Today's Date: 08/03/2023  END OF SESSION:  PT End of Session - 08/03/23 1225     Visit Number 6    Number of Visits 16    Date for PT Re-Evaluation 08/30/23    Authorization Type [employer] 16  1/28-3/25    Authorization - Visit Number 6    Authorization - Number of Visits 16    PT Start Time 1227    PT Stop Time 1310    PT Time Calculation (min) 43 min    Activity Tolerance Patient tolerated treatment well                Past Medical History:  Diagnosis Date   Allergy    Hypertension    Past Surgical History:  Procedure Laterality Date   BREAST BIOPSY Right    removal benign adenoma right breast   BTL     COLONOSCOPY  10 yrs ago    [place] [facility] exam"   Patient Active Problem List   Diagnosis Date Noted   Primary hypertension 04/18/2023   Hyperlipidemia 10/26/2022   Routine general medical examination at a health care facility 10/31/2014    PCP: Hillard Danker MD  REFERRING PROVIDER: Richardean Sale DO  REFERRING DIAG: O13.08, G89.29 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  It kicked in over the weekend,  Went to the gym yesterday and did upper body.  Going to walk this afternoon 1/2 mile.  Been doing 1/3 mile.  I've been cleaning the bathroom today.  I liked the DN. 70-80% better since start of care.    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 Chronic bilateral low back pain with right-sided sciatica 2. Degeneration of intervertebral disc of lumbar region with discogenic back pain and lower extremity pain 3. Greater trochanteric bursitis of right hip 4. Muscle strain of right gluteal region, subsequent encounter  -Chronic with exacerbation, complicated, subsequent visit - Complicated presentation that is likely multifactorial including lumbar stenosis with radiculopathy and greater trochanteric bursitis/gluteal tendinitis.  Patient continues to experience right posterior hip pain.  Patient did have significant improvement for 4 weeks after greater trochanteric/gluteal tendon insertion CSI performed on 05/17/2023, however all pain returned restarting physical activity.  Discussed that we hope for 3 months or greater relief from CSI - Recommend epidural right sided L4-L5 to evaluate if lumbar stenosis is a primary contributing factor to patient's pain.  Order placed - Patient elected for IM injection of methylprednisone 80 mg/Toradol 60 mg.  Injection given in clinic today and tolerated well. PERTINENT HISTORY:  HTN; mild high BP  PAIN:  Are you having pain? Yes NPRS scale:2-3/10  tenderness in back/sacral, numbness down the leg is manageable and lasts 30 sec to 1 min Pain location: tender right low back; numbness right post/lateral and anteriorthigh, right anterior shin spasms, tingling and numbness constant Pain orientation: Right  PAIN TYPE: burning and tingling Pain  description: constant and tingling  Aggravating factors: walk fast; lying on left side; sitting Relieving factors: after meds for 2 hours; right sidelying; take some weight off right leg; tested on Sunday and walked 1/4 mile felt better   PRECAUTIONS: None   WEIGHT BEARING RESTRICTIONS: No  FALLS:  Has patient fallen in last 6 months? No  LIVING ENVIRONMENT: Lives with: lives with their spouse Lives in: House/apartment [address] and older community  Stairs: No  OCCUPATION: retired  Human resources officer: running;   PLOF: Independent takes care of husband (Parkinsons) tries to limit lifting PATIENT GOALS: walk without pain, walk long distances, later return to running; be able to lift 40# bag of birdseed or 50# bag of deer corn (drags)   NEXT MD VISIT: next Tuesday 2/4 OBJECTIVE:  Note: Objective measures were completed at Evaluation unless otherwise noted.  DIAGNOSTIC FINDINGS:  MRI December 2024 IMPRESSION: 1. Severe L4-L5 facet arthrosis with moderate spinal canal stenosis and severe right neural foraminal stenosis. Round, low signal structure extending into the right neural foramen may be a sequestered disc fragment or synovial cyst. 2. Mild L3-L4 spinal canal stenosis.  Bone Density:  mild osteopenia femur? (not in Epic pt to look up findings)  PATIENT SURVEYS:  Modified Oswestry 44%   COGNITION: Overall cognitive status: Within functional limits for tasks assessed     SENSATION: Light touch: Impaired right thigh and anterior lower leg  MUSCLE LENGTH: Decreased right hip flexor length in sidelying and prone POSTURE: decreased lumbar lordosis and right iliac crest higher than left; moderate to severe left ankle pronation  PALPATION: Marked tenderness and spasm in right gluteals and piriformis  LUMBAR ROM:  DKTC mild increase but no worse ; prone lying in hip/buttock ; POE less pain; press up less tingling in leg?   AROM eval 2/26  Flexion 25% limited WFLs  Extension 25%  limited WFLs  Right lateral flexion 10% limited WFLs  Left lateral flexion 10% limited WFLs  Right rotation    Left rotation     (Blank rows = not tested)  TRUNK STRENGTH:  Decreased activation of transverse abdominus muscles; abdominals 4-/5; decreased activation  of lumbar multifidi; trunk extensors 4-/5  LOWER EXTREMITY ROM:   20% reduction in right hip mobility LOWER EXTREMITY MMT:  Able to rise from standard chair without UE assist  MMT Right eval Left eval  Hip flexion 4 5  Hip extension 4 5  Hip abduction 3+ 5  Hip adduction    Hip internal rotation    Hip external rotation 4 5  Knee flexion 4 5  Knee extension 4 5  Ankle dorsiflexion 3+ 3+  Ankle plantarflexion 3+ 3+  Ankle inversion    Ankle eversion     (Blank rows = not tested)  LUMBAR SPECIAL TESTS:  Negative slump and SLR but + neural tension   FUNCTIONAL TESTS:  SLS 2 sec right/left 5x STS 15.18 sec no UE use 3 MWT 443 feet  pain 8/10 following, increased numbness/tingling  2/17 TUG 8.07 sec  DGI 23/24  EC x 10 sec no problem  2/26: 5x STS 11.22   GAIT: Comments: decreased gait speed, increased forward trunk flexion, forward head TREATMENT DATE: 08/03/23  Nustep L5 x 7 min PT present to discuss status WB on right with left 3 way leg raises 10x focusing on level hips, cues for standing tall to activate glute SLS on right "stand tall" 10 sec holds 5x Resisted walking fwd/bwd 10# x 10 ea Prone glute squeeze 10x 5 sec hold 5x STS  Trigger Point Dry Needling Subsequent Treatment: Instructions provided previously at initial dry needling treatment.3 Patient Verbal Consent Given: Yes Education Handout Provided: Yes Muscles Treated: right lumbar multifidi,right gluteals, right piriformis; right prox HS Electrical Stimulation Performed: Yes, Parameters: 1.5 ma 80 pps 8 min 2 channels Treatment Response/Outcome: decreased pain Utilized skilled palpation to identify bony landmarks and trigger points.  Able  to illicit twitch response and muscle elongation.     TREATMENT DATE: 08/01/23  Nustep L5 x 7 min PT present to discuss status Sit to stand: 5# kettlebell 2x10 Seated hamstring 2x20 seconds  Forward ball rolls: x8 with 5" hold Rows GTB 2x10 with TA contraction with semi tandem stance Shoulder extension GTB 2x10 with TA contraction Pallof press red x 10 B Resisted walking fwd/bwd 10# x 10 ea Standing hip ABD 2x10 bil- standing on balance pad  TREATMENT DATE: 07/18/23  Nustep L5 x 6 min PT present to discuss status DGI 23/24, TUG completed Rows GTB 2x10 with TA contraction one set with semi tandem stance Ext GTB 2x10 with TA contraction Pallof press red x 10 B (did with green first but too challeging) Functional squat with red cord x 10 - to simulate helping husband with briefs in functional squat position. Dead lift with red cord x 10 Chair push with 25# x 40 feet, with 50# x 40 ft Resisted walking fwd/bwd 10# x 5 ea Standing hip ABD 2x10 B Standing hip ext x 10 increases numbness so held exercise Seated KTC relieves numbness                                PATIENT EDUCATION:  Education details: Educated patient on anatomy and physiology of current symptoms, prognosis, plan of care as well as initial self care strategies to promote recovery Person educated: Patient Education method: Explanation Education comprehension: verbalized understanding  HOME EXERCISE PROGRAM: Access Code: [email or web address] URL: [email or web address] Date: 07/25/2023 Prepared by: Raynelle Fanning  Exercises - Lying Prone  - 1 x daily - 7 x weekly - 1 sets - 10  reps - Prone Press Up On Elbows  - 1 x daily - 7 x weekly - 1 sets - 5 reps - Prone Press Up  - 1 x daily - 7 x weekly - 1 sets - 5-10 reps - Supine March  - 1 x daily - 7 x weekly - 1 sets - 8-10 reps - Hooklying Isometric Hip Flexion with Opposite Arm  - 1 x daily - 7 x weekly - 1 sets - 8-10 reps - 5 hold - Hooklying Clamshell with Resistance  - 1  x daily - 7 x weekly - 1 sets - 8-10 reps - 5 hold - Supine Double Knee to Chest  - 2 x daily - 7 x weekly - 1 sets - 1-10 reps - 0-20 sec hold - Standing Hip Abduction with Counter Support  - 1 x daily - 3 x weekly - 2 sets - 10 reps  ASSESSMENT:  CLINICAL IMPRESSION: Symptom intensity much improved since start of care.  5x STS improved by > 4 sec.  Good response to exercise without symptoms exacerbation.  Electrical stimulation added to indwelling needles for further impacts on pain relief and neuromuscular changes Therapist monitoring response to all interventions and modifying treatment accordingly. All STGs met.    OBJECTIVE IMPAIRMENTS: decreased activity tolerance, difficulty walking, decreased ROM, decreased strength, impaired perceived functional ability, increased muscle spasms, impaired flexibility, postural dysfunction, and pain.   ACTIVITY LIMITATIONS: lifting, sitting, standing, sleeping, locomotion level, and caring for others  PARTICIPATION LIMITATIONS: meal prep, cleaning, laundry, interpersonal relationship, and community activity  PERSONAL FACTORS: 1-2 comorbidities: HTN, osteopenia  are also affecting patient's functional outcome.   REHAB POTENTIAL: Good  CLINICAL DECISION MAKING: Stable/uncomplicated  EVALUATION COMPLEXITY: Low   GOALS: Goals reviewed with patient? Yes  SHORT TERM GOALS: Target date: 08/02/2023    The patient will demonstrate knowledge of basic self care strategies and exercises to promote healing  Baseline: Goal status: met 2/26  2.  The patient will report a 30% improvement in pain levels with functional activities which are currently difficult including walking, sitting Baseline:  Goal status: met 2/25 3.  The patient will have improved gait stamina and speed needed to ambulate 800 feet in 6 minutes  Baseline:  Goal status: met 2/25  4.  5x STS improved to 13 sec indicating improved LE strength Baseline:  Goal status: met  2/25    LONG TERM GOALS: Target date: 08/30/2023   The patient will be independent in a safe self progression of a home exercise program to promote further recovery of function  Baseline:  Goal status: INITIAL  2.  The patient will report a 70% improvement in pain levels with functional activities which are currently difficult including walking, standing, sitting Baseline:  Goal status: INITIAL  3.  The patient will be able to walk 1/4 to 1/2 mile with pain level < 5/10 Baseline:  Goal status: INITIAL  4.  The patient will have improved trunk flexor and extensor muscle strength to at least 4+/5 needed for lifting medium weight objects such as bird seed and deer corn bags Baseline:  Goal status: INITIAL  5.  Modified Oswestry score improved to 32% indicating improved function with less pain Baseline:  Goal status: INITIAL   PLAN:  PT FREQUENCY: 2x/week  PT DURATION: 8 weeks  PLANNED INTERVENTIONS: 97164- PT Re-evaluation, 97110-Therapeutic exercises, 97530- Therapeutic activity, [id number]- Neuromuscular re-education, 97535- Self Care, 16109- Manual therapy, [id number]- Aquatic Therapy, 97014- Electrical stimulation (unattended), [id number]- Electrical stimulation (manual),  16109- Ultrasound, 60454- Traction (mechanical), [id number]- Ionotophoresis 4mg /ml Dexamethasone, Patient/Family education, Taping, Dry Needling, Joint mobilization, Spinal manipulation, Spinal mobilization, Cryotherapy, and Moist heat.  PLAN FOR NEXT SESSION: DN combined with ES; continue core strength monitoring numbness sx   Lavinia Sharps, PT 08/03/23 2:47 PM Phone: [phone] Fax: [phone]

## 2023-08-07 NOTE — Therapy (Incomplete)
 OUTPATIENT PHYSICAL THERAPY THORACOLUMBAR PROGRESS NOTE   Patient Name: Kristi Alvarado MRN: [id number] DOB:1951-05-31, 73 y.o., female Today's Date: 08/08/2023  END OF SESSION:  PT End of Session - 08/08/23 1234     Visit Number 7    Number of Visits 16    Date for PT Re-Evaluation 08/30/23    Authorization Type [employer] 16  1/28-3/25    Authorization - Visit Number 7    Authorization - Number of Visits 16    PT Start Time 1234    PT Stop Time 1318    PT Time Calculation (min) 44 min    Activity Tolerance Patient tolerated treatment well    Behavior During Therapy WFL for tasks assessed/performed                 Past Medical History:  Diagnosis Date   Allergy    Hypertension    Past Surgical History:  Procedure Laterality Date   BREAST BIOPSY Right    removal benign adenoma right breast   BTL     COLONOSCOPY  10 yrs ago    [place] [facility] [facility]"   Patient Active Problem List   Diagnosis Date Noted   Primary hypertension 04/18/2023   Hyperlipidemia 10/26/2022   Routine general medical examination at a health care facility 10/31/2014    PCP: Hillard Danker MD  REFERRING PROVIDER: Richardean Sale DO  REFERRING DIAG: Q03.47, G89.29 chronic bil low back pain with right sided sciatica  Rationale for Evaluation and Treatment: Rehabilitation  THERAPY DIAG:  Back pain; weakness  ONSET DATE: end of October  SUBJECTIVE:                                                                                                                                                                                           SUBJECTIVE STATEMENT: Patient was doing well, but then went to the gym and did a back machine and a core machine and now has a little pain. Pain is right at sacral spine. Nothing in the hip today. TENS unit helped yesterday and Saturday helped.    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 Chronic bilateral low back pain with right-sided sciatica 2. Degeneration of intervertebral disc of lumbar region with discogenic back pain and lower extremity pain 3. Greater trochanteric bursitis of right hip 4. Muscle strain of right gluteal region, subsequent encounter  -Chronic with exacerbation, complicated, subsequent visit - Complicated presentation that is likely multifactorial including lumbar stenosis with radiculopathy and greater trochanteric bursitis/gluteal tendinitis.  Patient continues to experience right posterior hip pain.  Patient did have significant improvement for 4 weeks after greater trochanteric/gluteal tendon insertion CSI performed on 05/17/2023, however all pain returned restarting physical activity.  Discussed that we hope for 3 months or greater relief from CSI - Recommend epidural right sided L4-L5 to evaluate if lumbar stenosis is a primary contributing factor to patient's pain.  Order placed - Patient elected for IM injection of methylprednisone 80 mg/Toradol 60 mg.  Injection given in clinic today and tolerated well. PERTINENT HISTORY:  HTN; mild high BP  PAIN:  Are you having pain? Yes NPRS scale:4/10  tenderness in back/sacral, numbness down the leg is manageable and lasts 30 sec to 1 min Pain location: tender right low back; numbness right post/lateral and anteriorthigh, right anterior shin spasms, tingling and numbness constant Pain orientation: Right  PAIN  TYPE: burning and tingling Pain description: constant and tingling  Aggravating factors: walk fast; lying on left side; sitting Relieving factors: after meds for 2 hours; right sidelying; take some weight off right leg; tested on Sunday and walked 1/4 mile felt better   PRECAUTIONS: None   WEIGHT BEARING RESTRICTIONS: No  FALLS:  Has patient fallen in last 6 months? No  LIVING ENVIRONMENT: Lives with: lives with their spouse Lives in: House/apartment 8 and older community  Stairs: No  OCCUPATION: retired  Human resources officer: running;   PLOF: Independent takes care of husband (Parkinsons) tries to limit lifting PATIENT GOALS: walk without pain, walk long distances, later return to running; be able to lift 40# bag of birdseed or 50# bag of deer corn (drags)   NEXT MD VISIT: next Tuesday 2/4 OBJECTIVE:  Note: Objective measures were completed at Evaluation unless otherwise noted.  DIAGNOSTIC FINDINGS:  MRI December 2024 IMPRESSION: 1. Severe L4-L5 facet arthrosis with moderate spinal canal stenosis and severe right neural foraminal stenosis. Round, low signal structure extending into the right neural foramen may be a sequestered disc fragment or synovial cyst. 2. Mild L3-L4 spinal canal stenosis.  Bone Density:  mild osteopenia femur? (not in Epic pt to look up findings)  PATIENT SURVEYS:  Modified Oswestry 44%   COGNITION: Overall cognitive status: Within functional limits for tasks assessed     SENSATION: Light touch: Impaired right thigh and anterior lower leg  MUSCLE LENGTH: Decreased right hip flexor length in sidelying and prone POSTURE: decreased lumbar lordosis and right iliac crest higher than left; moderate to severe left ankle pronation  PALPATION: Marked tenderness and spasm in right gluteals and piriformis  LUMBAR ROM:  DKTC mild increase but no worse ; prone lying in hip/buttock ; POE less pain; press up less tingling in leg?   AROM eval 2/26  Flexion 25%  limited WFLs  Extension 25% limited WFLs  Right lateral flexion 10% limited WFLs  Left lateral flexion 10% limited WFLs  Right rotation    Left rotation     (Blank rows = not tested)  TRUNK STRENGTH:  Decreased activation of transverse abdominus muscles; abdominals  4-/5; decreased activation of lumbar multifidi; trunk extensors 4-/5  LOWER EXTREMITY ROM:   20% reduction in right hip mobility LOWER EXTREMITY MMT:  Able to rise from standard chair without UE assist  MMT Right eval Left eval  Hip flexion 4 5  Hip extension 4 5  Hip abduction 3+ 5  Hip adduction    Hip internal rotation    Hip external rotation 4 5  Knee flexion 4 5  Knee extension 4 5  Ankle dorsiflexion 3+ 3+  Ankle plantarflexion 3+ 3+  Ankle inversion    Ankle eversion     (Blank rows = not tested)  LUMBAR SPECIAL TESTS:  Negative slump and SLR but + neural tension   FUNCTIONAL TESTS:  SLS 2 sec right/left 5x STS 15.18 sec no UE use 3 MWT 443 feet  pain 8/10 following, increased numbness/tingling  2/17 TUG 8.07 sec  DGI 23/24  EC x 10 sec no problem  2/26: 5x STS 11.22   GAIT: Comments: decreased gait speed, increased forward trunk flexion, forward head  TREATMENT DATE:08/08/23  Nustep L5 x 7 min PT present to discuss status WB on right with left 3 way leg raises 10x focusing on level hips, cues for standing tall to activate glute - not tolerated today secondary to pain; Did L side SLS on right "stand tall" 10 sec holds 5x - caused numbness  Resisted walking fwd/bwd 10# x 10 ea  Trigger Point Dry Needling Subsequent Treatment: Instructions provided previously at initial dry needling treatment.3 Patient Verbal Consent Given: Yes Education Handout Provided: Yes Muscles Treated:  right lumbar multifidi,right gluteals, right piriformis; right prox HS Electrical Stimulation Performed: Yes, Parameters: 1.5 ma 80 pps 8 min 2 channels Treatment Response/Outcome: decreased pain Utilized skilled  palpation to identify bony landmarks and trigger points.  Able to illicit twitch response and muscle elongation.  Soft tissue mobilization to R lumbar and gluteals following to further promote tissue elongation and decreased pain.      TREATMENT DATE: 08/03/23  Nustep L5 x 7 min PT present to discuss status WB on right with left 3 way leg raises 10x focusing on level hips, cues for standing tall to activate glute SLS on right "stand tall" 10 sec holds 5x Resisted walking fwd/bwd 10# x 10 ea Prone glute squeeze 10x 5 sec hold 5x STS  Trigger Point Dry Needling Subsequent Treatment: Instructions provided previously at initial dry needling treatment.3 Patient Verbal Consent Given: Yes Education Handout Provided: Yes Muscles Treated: right lumbar multifidi,right gluteals, right piriformis; right prox HS Electrical Stimulation Performed: Yes, Parameters: 1.5 ma 80 pps 8 min 2 channels Treatment Response/Outcome: decreased pain Utilized skilled palpation to identify bony landmarks and trigger points.  Able to illicit twitch response and muscle elongation.     TREATMENT DATE: 08/01/23  Nustep L5 x 7 min PT present to discuss status Sit to stand: 5# kettlebell 2x10 Seated hamstring 2x20 seconds  Forward ball rolls: x8 with 5" hold Rows GTB 2x10 with TA contraction with semi tandem stance Shoulder extension GTB 2x10 with TA contraction Pallof press red x 10 B Resisted walking fwd/bwd 10# x 10 ea Standing hip ABD 2x10 bil- standing on balance pad  TREATMENT DATE: 07/18/23  Nustep L5 x 6 min PT present to discuss status DGI 23/24, TUG completed Rows GTB 2x10 with TA contraction one set with semi tandem stance Ext GTB 2x10 with TA contraction Pallof press red x 10 B (did with green first but too challeging) Functional squat  with red cord x 10 - to simulate helping husband with briefs in functional squat position. Dead lift with red cord x 10 Chair push with 25# x 40 feet, with 50# x 40  ft Resisted walking fwd/bwd 10# x 5 ea Standing hip ABD 2x10 B Standing hip ext x 10 increases numbness so held exercise Seated KTC relieves numbness                                PATIENT EDUCATION:  Education details: Educated patient on anatomy and physiology of current symptoms, prognosis, plan of care as well as initial self care strategies to promote recovery Person educated: Patient Education method: Explanation Education comprehension: verbalized understanding  HOME EXERCISE PROGRAM: Access Code: [email or web address] URL: [email or web address] Date: 07/25/2023 Prepared by: Raynelle Fanning  Exercises - Lying Prone  - 1 x daily - 7 x weekly - 1 sets - 10 reps - Prone Press Up On Elbows  - 1 x daily - 7 x weekly - 1 sets - 5 reps - Prone Press Up  - 1 x daily - 7 x weekly - 1 sets - 5-10 reps - Supine March  - 1 x daily - 7 x weekly - 1 sets - 8-10 reps - Hooklying Isometric Hip Flexion with Opposite Arm  - 1 x daily - 7 x weekly - 1 sets - 8-10 reps - 5 hold - Hooklying Clamshell with Resistance  - 1 x daily - 7 x weekly - 1 sets - 8-10 reps - 5 hold - Supine Double Knee to Chest  - 2 x daily - 7 x weekly - 1 sets - 1-10 reps - 0-20 sec hold - Standing Hip Abduction with Counter Support  - 1 x daily - 3 x weekly - 2 sets - 10 reps  ASSESSMENT:  CLINICAL IMPRESSION: Ivylynn presents with increased pain today after using back machine at the gym at the end of last week. She could not tolerate SLS activities on the R LE today. She had mild discomfort with resisted walking, but wanted to continue. Excellent response to manual therapy, DN and estim with pt reporting significant relief at end of session. Manual was done in prone over pillow as full extension not tolerated. Advised pt to avoid extension at present. She continues to demonstrate potential for improvement and would benefit from continued skilled therapy to address impairments.      OBJECTIVE IMPAIRMENTS: decreased activity  tolerance, difficulty walking, decreased ROM, decreased strength, impaired perceived functional ability, increased muscle spasms, impaired flexibility, postural dysfunction, and pain.   ACTIVITY LIMITATIONS: lifting, sitting, standing, sleeping, locomotion level, and caring for others  PARTICIPATION LIMITATIONS: meal prep, cleaning, laundry, interpersonal relationship, and community activity  PERSONAL FACTORS: 1-2 comorbidities: HTN, osteopenia  are also affecting patient's functional outcome.   REHAB POTENTIAL: Good  CLINICAL DECISION MAKING: Stable/uncomplicated  EVALUATION COMPLEXITY: Low   GOALS: Goals reviewed with patient? Yes  SHORT TERM GOALS: Target date: 08/02/2023    The patient will demonstrate knowledge of basic self care strategies and exercises to promote healing  Baseline: Goal status: met 2/26  2.  The patient will report a 30% improvement in pain levels with functional activities which are currently difficult including walking, sitting Baseline:  Goal status: met 2/25 3.  The patient will have improved gait stamina and speed needed to ambulate 800 feet in 6 minutes  Baseline:  Goal status: met 2/25  4.  5x  STS improved to 13 sec indicating improved LE strength Baseline:  Goal status: met 2/25    LONG TERM GOALS: Target date: 08/30/2023   The patient will be independent in a safe self progression of a home exercise program to promote further recovery of function  Baseline:  Goal status: INITIAL  2.  The patient will report a 70% improvement in pain levels with functional activities which are currently difficult including walking, standing, sitting Baseline:  Goal status: INITIAL  3.  The patient will be able to walk 1/4 to 1/2 mile with pain level < 5/10 Baseline:  Goal status: INITIAL  4.  The patient will have improved trunk flexor and extensor muscle strength to at least 4+/5 needed for lifting medium weight objects such as bird seed and deer  corn bags Baseline:  Goal status: INITIAL  5.  Modified Oswestry score improved to 32% indicating improved function with less pain Baseline:  Goal status: INITIAL   PLAN:  PT FREQUENCY: 2x/week  PT DURATION: 8 weeks  PLANNED INTERVENTIONS: 97164- PT Re-evaluation, 97110-Therapeutic exercises, 97530- Therapeutic activity, 97112- Neuromuscular re-education, 97535- Self Care, [id number]- Manual therapy, [id number]- Aquatic Therapy, 97014- Electrical stimulation (unattended), [id number]- Electrical stimulation (manual), [id number]- Ultrasound, [id number]- Traction (mechanical), [id number]- Ionotophoresis 4mg /ml Dexamethasone, Patient/Family education, Taping, Dry Needling, Joint mobilization, Spinal manipulation, Spinal mobilization, Cryotherapy, and Moist heat.  PLAN FOR NEXT SESSION: assess response and continue with DN combined with ES; continue core strength monitoring numbness sx, Avoid extension based exercises.  Solon Palm, PT 08/08/23 1:24 PM Phone: [phone] Fax: [phone]

## 2023-08-08 ENCOUNTER — Encounter: Payer: Self-pay | Admitting: Physical Therapy

## 2023-08-08 ENCOUNTER — Ambulatory Visit: Payer: Medicare PPO | Attending: Sports Medicine | Admitting: Physical Therapy

## 2023-08-08 DIAGNOSIS — M5441 Lumbago with sciatica, right side: Secondary | ICD-10-CM | POA: Insufficient documentation

## 2023-08-08 DIAGNOSIS — G8929 Other chronic pain: Secondary | ICD-10-CM | POA: Diagnosis not present

## 2023-08-08 DIAGNOSIS — M6281 Muscle weakness (generalized): Secondary | ICD-10-CM

## 2023-08-10 ENCOUNTER — Encounter: Payer: Medicare PPO | Admitting: Physical Therapy

## 2023-08-11 ENCOUNTER — Ambulatory Visit: Admitting: Physical Therapy

## 2023-08-16 ENCOUNTER — Ambulatory Visit: Payer: Medicare PPO | Admitting: Physical Therapy

## 2023-08-16 DIAGNOSIS — M6281 Muscle weakness (generalized): Secondary | ICD-10-CM

## 2023-08-16 DIAGNOSIS — M5441 Lumbago with sciatica, right side: Secondary | ICD-10-CM | POA: Diagnosis not present

## 2023-08-16 DIAGNOSIS — G8929 Other chronic pain: Secondary | ICD-10-CM | POA: Diagnosis not present

## 2023-08-16 NOTE — Therapy (Signed)
 OUTPATIENT PHYSICAL THERAPY THORACOLUMBAR PROGRESS NOTE   Patient Name: Kristi Alvarado MRN: [id number] DOB:07-Nov-1950, 73 y.o., female Today's Date: 08/16/2023  END OF SESSION:  PT End of Session - 08/16/23 1147     Visit Number 8    Number of Visits 16    Date for PT Re-Evaluation 08/30/23    Authorization Type [employer] 16  1/28-3/25    Authorization - Number of Visits 16    PT Start Time 1145    PT Stop Time 1225    PT Time Calculation (min) 40 min    Activity Tolerance Patient tolerated treatment well    Behavior During Therapy WFL for tasks assessed/performed                 Past Medical History:  Diagnosis Date   Allergy    Hypertension    Past Surgical History:  Procedure Laterality Date   BREAST BIOPSY Right    removal benign adenoma right breast   BTL     COLONOSCOPY  10 yrs ago    [place] [facility] exam"   Patient Active Problem List   Diagnosis Date Noted   Primary hypertension 04/18/2023   Hyperlipidemia 10/26/2022   Routine general medical examination at a health care facility 10/31/2014    PCP: Hillard Danker MD  REFERRING PROVIDER: Richardean Sale DO  REFERRING DIAG: Y86.57, G89.29 chronic bil low back pain with right sided sciatica  Rationale for Evaluation and Treatment: Rehabilitation  THERAPY DIAG:  Back pain; weakness  ONSET DATE: end of October  SUBJECTIVE:                                                                                                                                                                                           SUBJECTIVE STATEMENT: Had a rough weekend with back and leg pain.  I did arms at the gym and the bike.  Better today.  Could do 3/4 mile with some rest breaks walking. I like the DN with ES. Less lower leg numbness lasting 5-10 sec then goes away.     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 Chronic bilateral low back pain with right-sided sciatica 2. Degeneration of intervertebral disc of lumbar region with discogenic back pain and lower extremity pain 3. Greater trochanteric bursitis of right hip 4. Muscle strain of right gluteal region, subsequent encounter  -Chronic with exacerbation, complicated, subsequent visit - Complicated presentation that is likely multifactorial including lumbar stenosis with radiculopathy and greater trochanteric bursitis/gluteal tendinitis.  Patient continues to experience right posterior hip pain.  Patient did have significant improvement for 4 weeks after greater trochanteric/gluteal tendon insertion CSI performed on 05/17/2023, however all pain returned restarting physical activity.  Discussed that we hope for 3 months or greater relief from CSI - Recommend epidural right sided L4-L5 to evaluate if lumbar stenosis is a primary contributing factor to patient's pain.  Order placed - Patient elected for IM injection of methylprednisone 80 mg/Toradol 60 mg.  Injection given in clinic today and tolerated well. PERTINENT HISTORY:  HTN; mild high BP  PAIN:  Are you having pain? Yes NPRS scale:3/10  tenderness in back/sacral, numbness down the leg  Pain location: tender right low back; numbness right post/lateral and anteriorthigh, right anterior shin spasms, tingling and numbness constant Pain orientation: Right  PAIN TYPE: burning and tingling Pain description: constant and tingling   Aggravating factors: walk fast; lying on left side; sitting Relieving factors: after meds for 2 hours; right sidelying; take some weight off right leg; tested on Sunday and walked 1/4 mile felt better   PRECAUTIONS: None   WEIGHT BEARING RESTRICTIONS: No  FALLS:  Has patient fallen in last 6 months? No  LIVING ENVIRONMENT: Lives with: lives with their spouse Lives in: House/apartment 65 and older community  Stairs: No  OCCUPATION: retired  Human resources officer: running;   PLOF: Independent takes care of husband (Parkinsons) tries to limit lifting PATIENT GOALS: walk without pain, walk long distances, later return to running; be able to lift 40# bag of birdseed or 50# bag of deer corn (drags)   NEXT MD VISIT: next Tuesday 2/4 OBJECTIVE:  Note: Objective measures were completed at Evaluation unless otherwise noted.  DIAGNOSTIC FINDINGS:  MRI December 2024 IMPRESSION: 1. Severe L4-L5 facet arthrosis with moderate spinal canal stenosis and severe right neural foraminal stenosis. Round, low signal structure extending into the right neural foramen may be a sequestered disc fragment or synovial cyst. 2. Mild L3-L4 spinal canal stenosis.  Bone Density:  mild osteopenia femur? (not in Epic pt to look up findings)  PATIENT SURVEYS:  Modified Oswestry 44%   COGNITION: Overall cognitive status: Within functional limits for tasks assessed     SENSATION: Light touch: Impaired right thigh and anterior lower leg  MUSCLE LENGTH: Decreased right hip flexor length in sidelying and prone POSTURE: decreased lumbar lordosis and right iliac crest higher than left; moderate to severe left ankle pronation  PALPATION: Marked tenderness and spasm in right gluteals and piriformis  LUMBAR ROM:  DKTC mild increase but no worse ; prone lying in hip/buttock ; POE less pain; press up less tingling in leg?   AROM eval 2/26  Flexion 25% limited WFLs  Extension 25% limited WFLs  Right lateral flexion  10% limited WFLs  Left lateral flexion 10% limited WFLs  Right rotation    Left rotation     (Blank rows = not tested)  TRUNK STRENGTH:  Decreased activation of transverse abdominus muscles; abdominals 4-/5; decreased activation of lumbar multifidi; trunk extensors 4-/5  LOWER EXTREMITY ROM:   20% reduction in right hip mobility LOWER EXTREMITY MMT:  Able to rise from standard chair without UE assist  MMT Right eval Left eval  Hip flexion 4 5  Hip extension 4 5  Hip abduction 3+ 5  Hip adduction    Hip internal rotation    Hip external rotation 4 5  Knee flexion 4 5  Knee extension 4 5  Ankle dorsiflexion 3+ 3+  Ankle plantarflexion 3+ 3+  Ankle inversion    Ankle eversion     (Blank rows = not tested)  LUMBAR SPECIAL TESTS:  Negative slump and SLR but + neural tension   FUNCTIONAL TESTS:  SLS 2 sec right/left 5x STS 15.18 sec no UE use 3 MWT 443 feet  pain 8/10 following, increased numbness/tingling  2/17 TUG 8.07 sec  DGI 23/24  EC x 10 sec no problem  2/26: 5x STS 11.22   GAIT:  Comments: decreased gait speed, increased forward trunk flexion, forward head  TREATMENT DATE:08/16/23  Discussion of current status Discussed holding off on bike riding due to irritability of proximal HS and gluteals Resisted walking fwd/bwd 10# x 10 ea Prone glute squeeze 10x 5 sec hold Manual therapy: soft tissue mobilization to right lumbar, gluteal and HS musculature Trigger Point Dry Needling Subsequent Treatment: Instructions provided previously at initial dry needling treatment.3 Patient Verbal Consent Given: Yes Education Handout Provided: Yes Muscles Treated:  right lumbar multifidi,right gluteals, right piriformis; right prox HS Electrical Stimulation Performed: Yes, Parameters: 1.5 ma 80 pps 8 min 2 channels Treatment Response/Outcome: decreased pain Utilized skilled palpation to identify bony landmarks and trigger points.  Able to illicit twitch response and muscle  elongation.  Soft tissue mobilization to R lumbar and gluteals following to further promote tissue elongation and decreased pain.      TREATMENT DATE:08/08/23  Nustep L5 x 7 min PT present to discuss status WB on right with left 3 way leg raises 10x focusing on level hips, cues for standing tall to activate glute - not tolerated today secondary to pain; Did L side SLS on right "stand tall" 10 sec holds 5x - caused numbness  Resisted walking fwd/bwd 10# x 10 ea  Trigger Point Dry Needling Subsequent Treatment: Instructions provided previously at initial dry needling treatment.3 Patient Verbal Consent Given: Yes Education Handout Provided: Yes Muscles Treated:  right lumbar multifidi,right gluteals, right piriformis; right prox HS Electrical Stimulation Performed: Yes, Parameters: 1.5 ma 80 pps 8 min 2 channels Treatment Response/Outcome: decreased pain Utilized skilled palpation to identify bony landmarks and trigger points.  Able to illicit twitch response and muscle elongation.  Soft tissue mobilization to R lumbar and gluteals following to further promote tissue elongation and decreased pain.      TREATMENT DATE: 08/03/23  Nustep L5 x 7 min PT present to discuss status WB on right with left 3 way leg raises 10x focusing on level hips, cues for standing tall to activate glute SLS on right "stand tall" 10 sec holds 5x Resisted walking fwd/bwd 10# x 10 ea Prone glute squeeze 10x 5 sec hold 5x STS  Trigger Point Dry Needling Subsequent Treatment: Instructions provided previously at initial dry needling treatment.3 Patient Verbal Consent Given: Yes Education Handout Provided: Yes Muscles Treated: right lumbar multifidi,right gluteals, right piriformis; right prox HS Electrical Stimulation Performed: Yes, Parameters: 1.5 ma 80 pps 8 min 2 channels Treatment Response/Outcome: decreased pain Utilized skilled palpation to identify bony landmarks and trigger points.  Able to illicit  twitch  response and muscle elongation.     TREATMENT DATE: 08/01/23  Nustep L5 x 7 min PT present to discuss status Sit to stand: 5# kettlebell 2x10 Seated hamstring 2x20 seconds  Forward ball rolls: x8 with 5" hold Rows GTB 2x10 with TA contraction with semi tandem stance Shoulder extension GTB 2x10 with TA contraction Pallof press red x 10 B Resisted walking fwd/bwd 10# x 10 ea Standing hip ABD 2x10 bil- standing on balance pad                               PATIENT EDUCATION:  Education details: Educated patient on anatomy and physiology of current symptoms, prognosis, plan of care as well as initial self care strategies to promote recovery Person educated: Patient Education method: Explanation Education comprehension: verbalized understanding  HOME EXERCISE PROGRAM: Access Code: [email or web address] URL: [email or web address] Date: 07/25/2023 Prepared by: Raynelle Fanning  Exercises - Lying Prone  - 1 x daily - 7 x weekly - 1 sets - 10 reps - Prone Press Up On Elbows  - 1 x daily - 7 x weekly - 1 sets - 5 reps - Prone Press Up  - 1 x daily - 7 x weekly - 1 sets - 5-10 reps - Supine March  - 1 x daily - 7 x weekly - 1 sets - 8-10 reps - Hooklying Isometric Hip Flexion with Opposite Arm  - 1 x daily - 7 x weekly - 1 sets - 8-10 reps - 5 hold - Hooklying Clamshell with Resistance  - 1 x daily - 7 x weekly - 1 sets - 8-10 reps - 5 hold - Supine Double Knee to Chest  - 2 x daily - 7 x weekly - 1 sets - 1-10 reps - 0-20 sec hold - Standing Hip Abduction with Counter Support  - 1 x daily - 3 x weekly - 2 sets - 10 reps  ASSESSMENT:  CLINICAL IMPRESSION: Some increase in symptoms with exercise although pain level < 5/10.  Discussed limiting ex's that cause compression of gluteals and proximal HS (like the bike).  She responds well to DN combined with ES with symptom relief.  Therapist monitoring response to all interventions and modifying treatment accordingly.      OBJECTIVE IMPAIRMENTS:  decreased activity tolerance, difficulty walking, decreased ROM, decreased strength, impaired perceived functional ability, increased muscle spasms, impaired flexibility, postural dysfunction, and pain.   ACTIVITY LIMITATIONS: lifting, sitting, standing, sleeping, locomotion level, and caring for others  PARTICIPATION LIMITATIONS: meal prep, cleaning, laundry, interpersonal relationship, and community activity  PERSONAL FACTORS: 1-2 comorbidities: HTN, osteopenia  are also affecting patient's functional outcome.   REHAB POTENTIAL: Good  CLINICAL DECISION MAKING: Stable/uncomplicated  EVALUATION COMPLEXITY: Low   GOALS: Goals reviewed with patient? Yes  SHORT TERM GOALS: Target date: 08/02/2023    The patient will demonstrate knowledge of basic self care strategies and exercises to promote healing  Baseline: Goal status: met 2/26  2.  The patient will report a 30% improvement in pain levels with functional activities which are currently difficult including walking, sitting Baseline:  Goal status: met 2/25 3.  The patient will have improved gait stamina and speed needed to ambulate 800 feet in 6 minutes  Baseline:  Goal status: met 2/25  4.  5x STS improved to 13 sec indicating improved LE strength Baseline:  Goal status: met 2/25    LONG TERM GOALS: Target date: 08/30/2023   The  patient will be independent in a safe self progression of a home exercise program to promote further recovery of function  Baseline:  Goal status: INITIAL  2.  The patient will report a 70% improvement in pain levels with functional activities which are currently difficult including walking, standing, sitting Baseline:  Goal status: INITIAL  3.  The patient will be able to walk 1/4 to 1/2 mile with pain level < 5/10 Baseline:  Goal status: INITIAL  4.  The patient will have improved trunk flexor and extensor muscle strength to at least 4+/5 needed for lifting medium weight objects such as  bird seed and deer corn bags Baseline:  Goal status: INITIAL  5.  Modified Oswestry score improved to 32% indicating improved function with less pain Baseline:  Goal status: INITIAL   PLAN:  PT FREQUENCY: 2x/week  PT DURATION: 8 weeks  PLANNED INTERVENTIONS: 97164- PT Re-evaluation, 97110-Therapeutic exercises, 97530- Therapeutic activity, 97112- Neuromuscular re-education, 97535- Self Care, 16109- Manual therapy, [id number]- Aquatic Therapy, 97014- Electrical stimulation (unattended), [id number]- Electrical stimulation (manual), [id number]- Ultrasound, [id number]- Traction (mechanical), [id number]- Ionotophoresis 4mg /ml Dexamethasone, Patient/Family education, Taping, Dry Needling, Joint mobilization, Spinal manipulation, Spinal mobilization, Cryotherapy, and Moist heat.  PLAN FOR NEXT SESSION:  DN combined with ES; continue core strength monitoring numbness sx, Avoid extension based exercises.  Lavinia Sharps, PT 08/16/23 12:23 PM Phone: [phone] Fax: [phone]

## 2023-08-18 ENCOUNTER — Ambulatory Visit: Payer: Medicare PPO | Admitting: Physical Therapy

## 2023-08-18 DIAGNOSIS — M5441 Lumbago with sciatica, right side: Secondary | ICD-10-CM | POA: Diagnosis not present

## 2023-08-18 DIAGNOSIS — M6281 Muscle weakness (generalized): Secondary | ICD-10-CM

## 2023-08-18 DIAGNOSIS — G8929 Other chronic pain: Secondary | ICD-10-CM

## 2023-08-18 NOTE — Therapy (Signed)
 OUTPATIENT PHYSICAL THERAPY THORACOLUMBAR PROGRESS NOTE   Patient Name: Kristi Alvarado MRN: [id number] DOB:12/08/1950, 73 y.o., female Today's Date: 08/18/2023  END OF SESSION:  PT End of Session - 08/18/23 1150     Visit Number 9    Number of Visits 16    Date for PT Re-Evaluation 08/30/23    Authorization Type [employer] 16  1/28-3/25    Authorization - Visit Number 8    Authorization - Number of Visits 16    PT Start Time 1146    PT Stop Time 1229    PT Time Calculation (min) 43 min    Activity Tolerance Patient tolerated treatment well                 Past Medical History:  Diagnosis Date   Allergy    Hypertension    Past Surgical History:  Procedure Laterality Date   BREAST BIOPSY Right    removal benign adenoma right breast   BTL     COLONOSCOPY  10 yrs ago    [place] [facility] exam"   Patient Active Problem List   Diagnosis Date Noted   Primary hypertension 04/18/2023   Hyperlipidemia 10/26/2022   Routine general medical examination at a health care facility 10/31/2014    PCP: Hillard Danker MD  REFERRING PROVIDER: Richardean Sale DO  REFERRING DIAG: X91.47, G89.29 chronic 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 Walked yesterday.  Numbness for 15 sec daily.   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 Chronic bilateral low back pain with right-sided sciatica 2. Degeneration of intervertebral disc of lumbar region with discogenic back pain  and lower extremity pain 3. Greater trochanteric bursitis of right hip 4. Muscle strain of right gluteal region, subsequent encounter  -Chronic with exacerbation, complicated, subsequent visit - Complicated presentation that is likely multifactorial including lumbar stenosis with radiculopathy and greater trochanteric bursitis/gluteal tendinitis.  Patient continues to experience right posterior hip pain.  Patient did have significant improvement for 4 weeks after greater trochanteric/gluteal tendon insertion CSI performed on 05/17/2023, however all pain returned restarting physical activity.  Discussed that we hope for 3 months or greater relief from CSI - Recommend epidural right sided L4-L5 to evaluate if lumbar stenosis is a primary contributing factor to patient's pain.  Order placed - Patient elected for IM injection of methylprednisone 80 mg/Toradol 60 mg.  Injection given in clinic today and tolerated well. PERTINENT HISTORY:  HTN; mild high BP  PAIN:  Are you having pain? Yes NPRS scale:3/10  tenderness in back/sacral, numbness down the leg  Pain location: tender right low back; numbness right post/lateral and anteriorthigh, right anterior shin spasms, tingling and numbness constant Pain orientation: Right  PAIN TYPE: burning and tingling Pain description: constant and tingling  Aggravating factors: walk fast; lying on left side; sitting Relieving factors: after meds for 2 hours; right sidelying; take some weight off right leg; tested on  Sunday and walked 1/4 mile felt better   PRECAUTIONS: None   WEIGHT BEARING RESTRICTIONS: No  FALLS:  Has patient fallen in last 6 months? No  LIVING ENVIRONMENT: Lives with: lives with their spouse Lives in: House/apartment [address] and older community  Stairs: No  OCCUPATION: retired  Human resources officer: running;   PLOF: Independent takes care of husband (Parkinsons) tries to limit lifting PATIENT GOALS: walk without pain, walk long distances, later return to running; be able to lift 40# bag of birdseed or 50# bag of deer corn (drags)   NEXT MD VISIT: next Tuesday 2/4 OBJECTIVE:  Note: Objective measures were completed at Evaluation unless otherwise noted.  DIAGNOSTIC FINDINGS:  MRI December 2024 IMPRESSION: 1. Severe L4-L5 facet arthrosis with moderate spinal canal stenosis and severe right neural foraminal stenosis. Round, low signal structure extending into the right neural foramen may be a sequestered disc fragment or synovial cyst. 2. Mild L3-L4 spinal canal stenosis.  Bone Density:  mild osteopenia femur? (not in Epic pt to look up findings)  PATIENT SURVEYS:  Modified Oswestry 44%   COGNITION: Overall cognitive status: Within functional limits for tasks assessed     SENSATION: Light touch: Impaired right thigh and anterior lower leg  MUSCLE LENGTH: Decreased right hip flexor length in sidelying and prone POSTURE: decreased lumbar lordosis and right iliac crest higher than left; moderate to severe left ankle pronation  PALPATION: Marked tenderness and spasm in right gluteals and piriformis  LUMBAR ROM:  DKTC mild increase but no worse ; prone lying in hip/buttock ; POE less pain; press up less tingling in leg?   AROM eval 2/26  Flexion 25% limited WFLs  Extension 25% limited WFLs  Right lateral flexion 10% limited WFLs  Left lateral flexion 10% limited WFLs  Right rotation    Left rotation     (Blank rows = not tested)  TRUNK STRENGTH:  Decreased activation of  transverse abdominus muscles; abdominals 4-/5; decreased activation of lumbar multifidi; trunk extensors 4-/5  LOWER EXTREMITY ROM:   20% reduction in right hip mobility LOWER EXTREMITY MMT:  Able to rise from standard chair without UE assist  MMT Right eval Left eval  Hip flexion 4 5  Hip extension 4 5  Hip abduction 3+ 5  Hip adduction    Hip internal rotation    Hip external rotation 4 5  Knee flexion 4 5  Knee extension 4 5  Ankle dorsiflexion 3+ 3+  Ankle plantarflexion 3+ 3+  Ankle inversion    Ankle eversion     (Blank rows = not tested)  LUMBAR SPECIAL TESTS:  Negative slump and SLR but + neural tension   FUNCTIONAL TESTS:  SLS 2 sec right/left 5x STS 15.18 sec no UE use 3 MWT 443 feet  pain 8/10 following, increased numbness/tingling  2/17 TUG 8.07 sec  DGI 23/24  EC x 10 sec no problem  2/26: 5x STS 11.22   GAIT:  Comments: decreased gait speed, increased forward trunk flexion, forward head TREATMENT DATE:08/18/23  Wall isometrics: hip abduction into ball 5 sec hold 5x right/left (added to HEP) Wall isometric: hip extension into wall 5 sec hold 5x right/left (added to HEP) Single leg RDL 6# WB on right  SLS with 6# left hip flexion 10x 2 sets (added to HEP) 2 inch lateral step ups 5 sec hold 10x right only (added to HEP)  TREATMENT DATE:08/16/23  Discussion of current status Discussed holding off on bike riding due to irritability of proximal HS and gluteals Resisted walking fwd/bwd 10# x 10 ea Prone glute squeeze 10x 5 sec hold Manual therapy: soft tissue mobilization to right lumbar, gluteal and HS musculature Trigger Point Dry Needling Subsequent Treatment: Instructions provided previously at initial dry needling treatment.3 Patient Verbal Consent Given: Yes Education Handout Provided: Yes Muscles Treated:  right lumbar multifidi,right gluteals, right piriformis; right prox HS Electrical Stimulation Performed: Yes, Parameters: 1.5 ma 80 pps 8 min  2 channels Treatment Response/Outcome: decreased pain Utilized skilled palpation to identify bony landmarks and trigger points.  Able to illicit twitch response and muscle elongation.  Soft tissue mobilization to R lumbar and gluteals following to further promote tissue elongation and decreased pain.      TREATMENT DATE:08/08/23  Nustep L5 x 7 min PT present to discuss status WB on right with left 3 way leg raises 10x focusing on level hips, cues for standing tall to activate glute - not tolerated today secondary to pain; Did L side SLS on right "stand tall" 10 sec holds 5x - caused numbness  Resisted walking fwd/bwd 10# x 10 ea  Trigger Point Dry Needling Subsequent Treatment: Instructions provided previously at initial dry needling treatment.3 Patient Verbal Consent Given: Yes Education Handout Provided: Yes Muscles Treated:  right lumbar multifidi,right gluteals, right piriformis; right prox HS Electrical Stimulation Performed: Yes, Parameters: 1.5 ma 80 pps 8 min 2 channels Treatment Response/Outcome: decreased pain Utilized skilled palpation to identify bony landmarks and trigger points.  Able to illicit twitch response and muscle elongation.  Soft tissue mobilization to R lumbar and gluteals following to further promote tissue elongation and decreased pain.      TREATMENT DATE: 08/03/23  Nustep L5 x 7 min PT present to discuss status WB on right with left 3 way leg raises 10x focusing on level hips, cues for standing tall to activate glute SLS on right "stand tall" 10 sec holds 5x Resisted walking fwd/bwd 10# x 10 ea Prone glute squeeze 10x 5 sec hold 5x STS  Trigger Point Dry Needling Subsequent Treatment: Instructions provided previously at initial dry needling treatment.3 Patient Verbal Consent Given: Yes Education Handout Provided: Yes Muscles Treated: right lumbar multifidi,right gluteals, right piriformis; right prox HS Electrical Stimulation  Performed: Yes, Parameters: 1.5 ma  80 pps 8 min 2 channels Treatment Response/Outcome: decreased pain Utilized skilled palpation to identify bony landmarks and trigger points.  Able to illicit twitch response and muscle elongation.                                   PATIENT EDUCATION:  Education details: Educated patient on anatomy and physiology of current symptoms, prognosis, plan of care as well as initial self care strategies to promote recovery Person educated: Patient Education method: Explanation Education comprehension: verbalized understanding  HOME EXERCISE PROGRAM: Access Code: [email or web address] URL: [email or web address] Date: 08/18/2023 Prepared by: Lavinia Sharps  Exercises - Lying Prone  - 1 x daily - 7 x weekly - 1 sets - 10 reps - Prone Press Up On Elbows  - 1 x daily - 7 x weekly - 1 sets - 5 reps - Prone Press Up  - 1 x daily - 7 x weekly - 1 sets - 5-10 reps - Supine March  - 1 x daily - 7 x weekly - 1 sets - 8-10 reps - Hooklying Isometric Hip Flexion with Opposite Arm  - 1 x daily - 7 x weekly - 1 sets - 8-10 reps - 5 hold - Hooklying Clamshell with Resistance  - 1 x daily - 7 x weekly - 1 sets - 8-10 reps - 5 hold - Supine Double Knee to Chest  - 2 x daily - 7 x weekly - 1 sets - 1-10 reps - 0-20 sec hold - Standing Hip Abduction with Counter Support  - 1 x daily - 3 x weekly - 2 sets - 10 reps - Standing Isometric Hip Abduction with Ball on Wall  - 1 x daily - 7 x weekly - 1 sets - 5 reps - 5 hold - Standing Hip Extension with Counter Support  - 1 x daily - 7 x weekly - 1 sets - 5 reps - 5 hold - Lateral Step Up with Counter Support  - 1 x daily - 7 x weekly - 1 sets - 10 reps - Forward T with Counter Support  - 1 x daily - 7 x weekly - 1 sets - 10 reps  ASSESSMENT:  CLINICAL IMPRESSION: Therapist progressing and updating HEP for increased intensity and challenge level for further strengthening and functional mobility.   Good response to isometrics with pain level < 5/10.  Intermittent LE  numbness lasting < 15 sec.  Therapist monitoring response and providing verbal cues to optimize ex effectiveness.      OBJECTIVE IMPAIRMENTS: decreased activity tolerance, difficulty walking, decreased ROM, decreased strength, impaired perceived functional ability, increased muscle spasms, impaired flexibility, postural dysfunction, and pain.   ACTIVITY LIMITATIONS: lifting, sitting, standing, sleeping, locomotion level, and caring for others  PARTICIPATION LIMITATIONS: meal prep, cleaning, laundry, interpersonal relationship, and community activity  PERSONAL FACTORS: 1-2 comorbidities: HTN, osteopenia  are also affecting patient's functional outcome.   REHAB POTENTIAL: Good  CLINICAL DECISION MAKING: Stable/uncomplicated  EVALUATION COMPLEXITY: Low   GOALS: Goals reviewed with patient? Yes  SHORT TERM GOALS: Target date: 08/02/2023    The patient will demonstrate knowledge of basic self care strategies and exercises to promote healing  Baseline: Goal status: met 2/26  2.  The patient will report a 30% improvement in pain levels with functional activities which are currently difficult including walking, sitting Baseline:  Goal status: met 2/25 3.  The patient  will have improved gait stamina and speed needed to ambulate 800 feet in 6 minutes  Baseline:  Goal status: met 2/25  4.  5x STS improved to 13 sec indicating improved LE strength Baseline:  Goal status: met 2/25    LONG TERM GOALS: Target date: 08/30/2023   The patient will be independent in a safe self progression of a home exercise program to promote further recovery of function  Baseline:  Goal status: INITIAL  2.  The patient will report a 70% improvement in pain levels with functional activities which are currently difficult including walking, standing, sitting Baseline:  Goal status: INITIAL  3.  The patient will be able to walk 1/4 to 1/2 mile with pain level < 5/10 Baseline:  Goal status:  INITIAL  4.  The patient will have improved trunk flexor and extensor muscle strength to at least 4+/5 needed for lifting medium weight objects such as bird seed and deer corn bags Baseline:  Goal status: INITIAL  5.  Modified Oswestry score improved to 32% indicating improved function with less pain Baseline:  Goal status: INITIAL   PLAN:  PT FREQUENCY: 2x/week  PT DURATION: 8 weeks  PLANNED INTERVENTIONS: 97164- PT Re-evaluation, 97110-Therapeutic exercises, 97530- Therapeutic activity, 97112- Neuromuscular re-education, 97535- Self Care, 95621- Manual therapy, [id number]- Aquatic Therapy, 97014- Electrical stimulation (unattended), [id number]- Electrical stimulation (manual), [id number]- Ultrasound, [id number]- Traction (mechanical), [id number]- Ionotophoresis 4mg /ml Dexamethasone, Patient/Family education, Taping, Dry Needling, Joint mobilization, Spinal manipulation, Spinal mobilization, Cryotherapy, and Moist heat.  PLAN FOR NEXT SESSION: right gluteal isometrics;  DN combined with ES; continue core strength monitoring numbness sx, Avoid extension based exercises.  Lavinia Sharps, PT 08/18/23 5:39 PM Phone: [phone] Fax: [phone]

## 2023-08-23 ENCOUNTER — Ambulatory Visit: Payer: Medicare PPO | Admitting: Physical Therapy

## 2023-08-23 DIAGNOSIS — M6281 Muscle weakness (generalized): Secondary | ICD-10-CM | POA: Diagnosis not present

## 2023-08-23 DIAGNOSIS — M5441 Lumbago with sciatica, right side: Secondary | ICD-10-CM | POA: Diagnosis not present

## 2023-08-23 DIAGNOSIS — G8929 Other chronic pain: Secondary | ICD-10-CM

## 2023-08-23 NOTE — Therapy (Signed)
 OUTPATIENT PHYSICAL THERAPY THORACOLUMBAR PROGRESS NOTE   Patient Name: Kristi Alvarado MRN: [id number] DOB:10-15-1950, 73 y.o., female Today's Date: 08/23/2023   Progress Note Reporting Period 1/28 to 08/23/23  See note below for Objective Data and Assessment of Progress/Goals.     END OF SESSION:  PT End of Session - 08/23/23 1148     Visit Number 10    Number of Visits 16    Date for PT Re-Evaluation 08/30/23    Authorization Type [employer] 16  1/28-3/25    Authorization - Number of Visits 20    PT Start Time 1146    PT Stop Time 1228    PT Time Calculation (min) 42 min    Activity Tolerance Patient tolerated treatment well                 Past Medical History:  Diagnosis Date   Allergy    Hypertension    Past Surgical History:  Procedure Laterality Date   BREAST BIOPSY Right    removal benign adenoma right breast   BTL     COLONOSCOPY  10 yrs ago    [place] [facility] exam"   Patient Active Problem List   Diagnosis Date Noted   Primary hypertension 04/18/2023   Hyperlipidemia 10/26/2022   Routine general medical examination at a health care facility 10/31/2014    PCP: Hillard Danker MD  REFERRING PROVIDER: Richardean Sale DO  REFERRING DIAG: M54.41, G89.29 chronic bil low back pain with right sided sciatica  Rationale for Evaluation and Treatment: Rehabilitation  THERAPY DIAG:  Back pain; weakness  ONSET DATE: end of October  SUBJECTIVE:                                                                                                                                                                                           SUBJECTIVE STATEMENT: I'm doing a lot better.  Walked 3/4 mile with mild pain only.  It was like a switch was flipped on feeling better.  I feel like I'm doing the right exercises.   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 Chronic bilateral low back pain with right-sided sciatica 2. Degeneration of intervertebral disc of lumbar region with discogenic back pain and lower extremity pain 3. Greater trochanteric bursitis of right hip 4. Muscle strain of right gluteal region, subsequent encounter  -Chronic with exacerbation, complicated, subsequent visit - Complicated presentation that is likely multifactorial including lumbar stenosis with radiculopathy and greater trochanteric bursitis/gluteal tendinitis.  Patient continues to experience right posterior hip pain.  Patient did have significant improvement for 4 weeks after greater trochanteric/gluteal tendon insertion CSI performed on 05/17/2023, however all pain returned restarting physical activity.  Discussed that we hope for 3 months or greater relief from CSI - Recommend epidural right sided L4-L5 to evaluate if lumbar stenosis is a primary contributing factor to patient's pain.  Order placed - Patient elected for IM injection of methylprednisone 80 mg/Toradol 60 mg.  Injection given in clinic today and tolerated well. PERTINENT HISTORY:  HTN; mild high BP  PAIN:  Are you having pain? Yes NPRS scale:3/10  tenderness in back/sacral, numbness down the leg  Pain location: tender right low back; numbness right post/lateral and anteriorthigh, right anterior shin spasms, tingling and numbness constant Pain orientation: Right  PAIN TYPE: burning and tingling Pain description: constant and tingling   Aggravating factors: walk fast; lying on left side; sitting Relieving factors: after meds for 2 hours; right sidelying; take some weight off right leg; tested on Sunday and walked 1/4 mile felt better   PRECAUTIONS: None   WEIGHT BEARING RESTRICTIONS: No  FALLS:  Has patient fallen in last 6 months? No  LIVING ENVIRONMENT: Lives with: lives with their spouse Lives in: House/apartment [address] and older community  Stairs: No  OCCUPATION: retired  Human resources officer: running;   PLOF: Independent takes care of husband (Parkinsons) tries to limit lifting PATIENT GOALS: walk without pain, walk long distances, later return to running; be able to lift 40# bag of birdseed or 50# bag of deer corn (drags)   NEXT MD VISIT: next Tuesday 2/4 OBJECTIVE:  Note: Objective measures were completed at Evaluation unless otherwise noted.  DIAGNOSTIC FINDINGS:  MRI December 2024 IMPRESSION: 1. Severe L4-L5 facet arthrosis with moderate spinal canal stenosis and severe right neural foraminal stenosis. Round, low signal structure extending into the right neural foramen may be a sequestered disc fragment or synovial cyst. 2. Mild L3-L4 spinal canal stenosis.  Bone Density:  mild osteopenia femur? (not in Epic pt to look up findings)  PATIENT SURVEYS:  Modified Oswestry 44%  3/18:  22%   COGNITION: Overall cognitive status: Within functional limits for tasks assessed     SENSATION: Light touch: Impaired right thigh and anterior lower leg  MUSCLE LENGTH: Decreased right hip flexor length in sidelying and prone POSTURE: decreased lumbar lordosis and right iliac crest higher than left; moderate to severe left ankle pronation  PALPATION: Marked tenderness and spasm in right gluteals and piriformis  LUMBAR ROM:  DKTC mild increase but no worse ; prone lying in hip/buttock ; POE less pain; press up less tingling in leg?   AROM eval 2/26  Flexion 25% limited WFLs  Extension 25% limited WFLs  Right  lateral flexion 10% limited WFLs  Left lateral flexion 10% limited WFLs  Right rotation    Left rotation     (Blank rows = not tested)  TRUNK STRENGTH:  Decreased activation of transverse abdominus muscles; abdominals 4-/5; decreased activation of lumbar multifidi; trunk extensors 4-/5 3/18:  trunk strength 4/5  LOWER EXTREMITY ROM:   20% reduction in right hip mobility LOWER EXTREMITY MMT:  Able to rise from standard chair without UE assist  MMT Right eval Left eval 3/18 Right  Hip flexion 4 5 R 4  Hip extension 4 5 R 4  Hip abduction 3+ 5 R 4-  Hip adduction     Hip internal rotation     Hip external rotation 4 5   Knee flexion 4 5 4+  Knee extension 4 5 4+  Ankle dorsiflexion 3+ 3+ 4  Ankle plantarflexion 3+ 3+ 4  Ankle inversion     Ankle eversion      (Blank rows = not tested)  LUMBAR SPECIAL TESTS:  Negative slump and SLR but + neural tension   FUNCTIONAL TESTS:  SLS 2 sec right/left 5x STS 15.18 sec no UE use 3 MWT 443 feet  pain 8/10 following, increased numbness/tingling  2/17 TUG 8.07 sec  DGI 23/24  EC x 10 sec no problem  2/26: 5x STS 11.22   GAIT:  Comments: decreased gait speed, increased forward trunk flexion, forward head  TREATMENT DATE:08/23/23  Modified Oswestry Discussion of current ex program and response to treatment Manual therapy: soft tissue mobilization to right lumbar, gluteal and HS musculature Trigger Point Dry Needling Subsequent Treatment: Instructions provided previously at initial dry needling treatment.3 Patient Verbal Consent Given: Yes Education Handout Provided: Yes Muscles Treated:  right lumbar multifidi,right gluteals, right piriformis; right prox HS Electrical Stimulation Performed: Yes, Parameters: 1.5 ma 80 pps 12 min 2 channels Treatment Response/Outcome: decreased pain Utilized skilled palpation to identify bony landmarks and trigger points.  Able to illicit twitch response and muscle elongation.  Soft tissue  mobilization to R lumbar and gluteals following to further promote tissue elongation and decreased pain.      TREATMENT DATE:08/18/23  Wall isometrics: hip abduction into ball 5 sec hold 5x right/left (added to HEP) Wall isometric: hip extension into wall 5 sec hold 5x right/left (added to HEP) Single leg RDL 6# WB on right  SLS with 6# left hip flexion 10x 2 sets (added to HEP) 2 inch lateral step ups 5 sec hold 10x right only (added to HEP)  TREATMENT DATE:08/16/23  Discussion of current status Discussed holding off on bike riding due to irritability of proximal HS and gluteals Resisted walking fwd/bwd 10# x 10 ea Prone glute squeeze 10x 5 sec hold Manual therapy: soft tissue mobilization to right lumbar, gluteal and HS musculature Trigger Point Dry Needling Subsequent Treatment: Instructions provided previously at initial dry needling treatment.3 Patient Verbal Consent Given: Yes Education Handout Provided: Yes Muscles Treated:  right lumbar multifidi,right gluteals, right piriformis; right prox HS Electrical Stimulation Performed: Yes, Parameters: 1.5 ma 80 pps 8 min 2 channels Treatment Response/Outcome: decreased pain Utilized skilled palpation to identify bony landmarks and trigger points.  Able to illicit twitch response and muscle elongation.  Soft tissue mobilization to R lumbar and gluteals following to further promote tissue elongation and decreased pain.      TREATMENT DATE:08/08/23  Nustep L5 x 7 min PT present to discuss status WB on right with left 3 way leg raises 10x focusing on level hips, cues for standing tall to activate glute - not tolerated today secondary to pain; Did L side SLS on right "stand tall" 10 sec holds 5x - caused numbness  Resisted walking fwd/bwd 10# x 10 ea  Trigger Point Dry Needling Subsequent Treatment: Instructions provided previously at initial dry needling  treatment.3 Patient Verbal Consent Given: Yes Education Handout Provided: Yes Muscles  Treated:  right lumbar multifidi,right gluteals, right piriformis; right prox HS Electrical Stimulation Performed: Yes, Parameters: 1.5 ma 80 pps 8 min 2 channels Treatment Response/Outcome: decreased pain Utilized skilled palpation to identify bony landmarks and trigger points.  Able to illicit twitch response and muscle elongation.  Soft tissue mobilization to R lumbar and gluteals following to further promote tissue elongation and decreased pain.                                    PATIENT EDUCATION:  Education details: Educated patient on anatomy and physiology of current symptoms, prognosis, plan of care as well as initial self care strategies to promote recovery Person educated: Patient Education method: Explanation Education comprehension: verbalized understanding  HOME EXERCISE PROGRAM: Access Code: [email or web address] URL: [email or web address] Date: 08/18/2023 Prepared by: Lavinia Sharps  Exercises - Lying Prone  - 1 x daily - 7 x weekly - 1 sets - 10 reps - Prone Press Up On Elbows  - 1 x daily - 7 x weekly - 1 sets - 5 reps - Prone Press Up  - 1 x daily - 7 x weekly - 1 sets - 5-10 reps - Supine March  - 1 x daily - 7 x weekly - 1 sets - 8-10 reps - Hooklying Isometric Hip Flexion with Opposite Arm  - 1 x daily - 7 x weekly - 1 sets - 8-10 reps - 5 hold - Hooklying Clamshell with Resistance  - 1 x daily - 7 x weekly - 1 sets - 8-10 reps - 5 hold - Supine Double Knee to Chest  - 2 x daily - 7 x weekly - 1 sets - 1-10 reps - 0-20 sec hold - Standing Hip Abduction with Counter Support  - 1 x daily - 3 x weekly - 2 sets - 10 reps - Standing Isometric Hip Abduction with Ball on Wall  - 1 x daily - 7 x weekly - 1 sets - 5 reps - 5 hold - Standing Hip Extension with Counter Support  - 1 x daily - 7 x weekly - 1 sets - 5 reps - 5 hold - Lateral Step Up with Counter Support  - 1 x daily - 7 x weekly - 1 sets - 10 reps - Forward T with Counter Support  - 1 x daily - 7 x weekly - 1  sets - 10 reps  ASSESSMENT:  CLINICAL IMPRESSION: Much improved Oswestry Disability Index from 44% disability on initial eval to 22% today.  Improved gait tolerance as well up to 3/4 mile. The patient would benefit from a continuation of skilled PT for a further progression of strengthening and functional mobility as well as pain relieving interventions (dry needling combined with electric stimulation). Will continue to update and promote independence in a HEP needed for a return to the highest functional level possible with ADLs.        OBJECTIVE IMPAIRMENTS: decreased activity tolerance, difficulty walking, decreased ROM, decreased strength, impaired perceived functional ability, increased muscle spasms, impaired flexibility, postural dysfunction, and pain.   ACTIVITY LIMITATIONS: lifting, sitting, standing, sleeping, locomotion level, and caring for others  PARTICIPATION LIMITATIONS: meal prep, cleaning, laundry, interpersonal relationship, and community activity  PERSONAL FACTORS: 1-2 comorbidities: HTN, osteopenia  are also affecting patient's functional outcome.   REHAB POTENTIAL: Good  CLINICAL DECISION MAKING: Stable/uncomplicated  EVALUATION  COMPLEXITY: Low   GOALS: Goals reviewed with patient? Yes  SHORT TERM GOALS: Target date: 08/02/2023    The patient will demonstrate knowledge of basic self care strategies and exercises to promote healing  Baseline: Goal status: met 2/26  2.  The patient will report a 30% improvement in pain levels with functional activities which are currently difficult including walking, sitting Baseline:  Goal status: met 2/25 3.  The patient will have improved gait stamina and speed needed to ambulate 800 feet in 6 minutes  Baseline:  Goal status: met 2/25  4.  5x STS improved to 13 sec indicating improved LE strength Baseline:  Goal status: met 2/25    LONG TERM GOALS: Target date: 08/30/2023   The patient will be independent in a  safe self progression of a home exercise program to promote further recovery of function  Baseline:  Goal status: ongoing  2.  The patient will report a 70% improvement in pain levels with functional activities which are currently difficult including walking, standing, sitting Baseline:  Goal status: ongoing  3.  The patient will be able to walk 1/4 to 1/2 mile with pain level < 5/10 Baseline:  Goal status: met 3/18  4.  The patient will have improved trunk flexor and extensor muscle strength to at least 4+/5 needed for lifting medium weight objects such as bird seed and deer corn bags Baseline:  Goal status: ongoing  5.  Modified Oswestry score improved to 32% indicating improved function with less pain Baseline:  Goal status: met 3/18  PLAN:  PT FREQUENCY: 2x/week  PT DURATION: 8 weeks  PLANNED INTERVENTIONS: 97164- PT Re-evaluation, 97110-Therapeutic exercises, 97530- Therapeutic activity, 97112- Neuromuscular re-education, 97535- Self Care, 13086- Manual therapy, [id number]- Aquatic Therapy, 97014- Electrical stimulation (unattended), [id number]- Electrical stimulation (manual), [id number]- Ultrasound, [id number]- Traction (mechanical), [id number]- Ionotophoresis 4mg /ml Dexamethasone, Patient/Family education, Taping, Dry Needling, Joint mobilization, Spinal manipulation, Spinal mobilization, Cryotherapy, and Moist heat.  PLAN FOR NEXT SESSION: driving to [place] to get sister today and driving back; right gluteal isometrics;  DN combined with ES; continue core strength monitoring numbness sx, ask about lifting (deer corn and bird seed)  Lavinia Sharps, PT 08/23/23 2:30 PM Phone: [phone] Fax: [phone]

## 2023-08-25 ENCOUNTER — Ambulatory Visit: Payer: Medicare PPO | Admitting: Physical Therapy

## 2023-08-25 DIAGNOSIS — M6281 Muscle weakness (generalized): Secondary | ICD-10-CM

## 2023-08-25 DIAGNOSIS — G8929 Other chronic pain: Secondary | ICD-10-CM

## 2023-08-25 DIAGNOSIS — M5441 Lumbago with sciatica, right side: Secondary | ICD-10-CM | POA: Diagnosis not present

## 2023-08-25 NOTE — Therapy (Signed)
 OUTPATIENT PHYSICAL THERAPY THORACOLUMBAR PROGRESS NOTE   Patient Name: Kristi Alvarado MRN: [id number] DOB:1951/03/17, 73 y.o., female Today's Date: 08/25/2023   END OF SESSION:  PT End of Session - 08/25/23 1148     Visit Number 11    Number of Visits 16    Date for PT Re-Evaluation 08/30/23    Authorization Type [employer] 16  1/28-3/25    Authorization - Number of Visits 20    PT Start Time 1145    PT Stop Time 1229    PT Time Calculation (min) 44 min    Activity Tolerance Patient tolerated treatment well                 Past Medical History:  Diagnosis Date   Allergy    Hypertension    Past Surgical History:  Procedure Laterality Date   BREAST BIOPSY Right    removal benign adenoma right breast   BTL     COLONOSCOPY  10 yrs ago    [facility]"   Patient Active Problem List   Diagnosis Date Noted   Primary hypertension 04/18/2023   Hyperlipidemia 10/26/2022   Routine general medical examination at a health care facility 10/31/2014    PCP: Hillard Danker MD  REFERRING PROVIDER: Richardean Sale DO  REFERRING DIAG: W09.81, G89.29 chronic bil low back pain with right sided sciatica  Rationale for Evaluation and Treatment: Rehabilitation  THERAPY DIAG:  Back pain; weakness  ONSET DATE: end of October  SUBJECTIVE:                                                                                                                                                                                           SUBJECTIVE STATEMENT: Did OK with trips to and from [place] the last 2 days.  Lifted 50# bag of deer corn yesterday and was able to drag it.    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 Chronic bilateral low back pain with right-sided sciatica 2. Degeneration of intervertebral disc of lumbar region  with discogenic back pain and lower extremity pain 3. Greater trochanteric bursitis of right hip 4. Muscle strain of right gluteal region, subsequent encounter  -Chronic with exacerbation, complicated, subsequent visit - Complicated presentation that is likely multifactorial including lumbar stenosis with radiculopathy and greater trochanteric bursitis/gluteal tendinitis.  Patient continues to experience right posterior hip pain.  Patient did have significant improvement for 4 weeks after greater trochanteric/gluteal tendon insertion CSI performed on 05/17/2023, however all pain returned restarting physical activity.  Discussed that we hope for 3 months or greater relief from CSI - Recommend epidural right sided L4-L5 to evaluate if lumbar stenosis is a primary contributing factor to patient's pain.  Order placed - Patient elected for IM injection of methylprednisone 80 mg/Toradol 60 mg.  Injection given in clinic today and tolerated well. PERTINENT HISTORY:  HTN; mild high BP  PAIN:  Are you having pain? Yes NPRS scale:3/10  tenderness in back/sacral, numbness down the leg  Pain location: tender right low back; numbness right post/lateral and anteriorthigh, right anterior shin spasms, tingling and numbness constant Pain orientation: Right  PAIN TYPE: burning and tingling Pain description: constant and tingling  Aggravating factors: walk fast; lying on left side; sitting Relieving factors: after meds for 2 hours; right sidelying; take some weight off right leg; tested on  Sunday and walked 1/4 mile felt better   PRECAUTIONS: None   WEIGHT BEARING RESTRICTIONS: No  FALLS:  Has patient fallen in last 6 months? No  LIVING ENVIRONMENT: Lives with: lives with their spouse Lives in: House/apartment [address] and older community  Stairs: No  OCCUPATION: retired  Human resources officer: running;   PLOF: Independent takes care of husband (Parkinsons) tries to limit lifting PATIENT GOALS: walk without pain, walk long distances, later return to running; be able to lift 40# bag of birdseed or 50# bag of deer corn (drags)   NEXT MD VISIT: next Tuesday 2/4 OBJECTIVE:  Note: Objective measures were completed at Evaluation unless otherwise noted.  DIAGNOSTIC FINDINGS:  MRI December 2024 IMPRESSION: 1. Severe L4-L5 facet arthrosis with moderate spinal canal stenosis and severe right neural foraminal stenosis. Round, low signal structure extending into the right neural foramen may be a sequestered disc fragment or synovial cyst. 2. Mild L3-L4 spinal canal stenosis.  Bone Density:  mild osteopenia femur? (not in Epic pt to look up findings)  PATIENT SURVEYS:  Modified Oswestry 44%  3/18:  22%   COGNITION: Overall cognitive status: Within functional limits for tasks assessed     SENSATION: Light touch: Impaired right thigh and anterior lower leg  MUSCLE LENGTH: Decreased right hip flexor length in sidelying and prone POSTURE: decreased lumbar lordosis and right iliac crest higher than left; moderate to severe left ankle pronation  PALPATION: Marked tenderness and spasm in right gluteals and piriformis  LUMBAR ROM:  DKTC mild increase but no worse ; prone lying in hip/buttock ; POE less pain; press up less tingling in leg?   AROM eval 2/26  Flexion 25% limited WFLs  Extension 25% limited WFLs  Right lateral flexion 10% limited WFLs  Left lateral flexion 10% limited WFLs  Right rotation    Left rotation     (Blank rows = not tested)  TRUNK STRENGTH:  Decreased  activation of transverse abdominus muscles; abdominals 4-/5; decreased activation of lumbar multifidi; trunk extensors 4-/5 3/18: trunk strength 4/5  LOWER EXTREMITY ROM:   20% reduction in right hip mobility LOWER EXTREMITY MMT:  Able to rise from standard chair without UE assist  MMT Right eval Left eval 3/18 Right  Hip flexion 4 5 R 4  Hip extension 4 5 R 4  Hip abduction 3+ 5 R 4-  Hip adduction     Hip internal rotation     Hip external rotation 4 5   Knee flexion 4 5 4+  Knee extension 4 5 4+  Ankle dorsiflexion 3+ 3+ 4  Ankle plantarflexion 3+ 3+ 4  Ankle inversion     Ankle eversion      (Blank rows = not tested)  LUMBAR SPECIAL TESTS:  Negative slump and SLR but + neural tension   FUNCTIONAL TESTS:  SLS 2 sec right/left 5x STS 15.18 sec no UE use 3 MWT 443 feet  pain 8/10 following, increased numbness/tingling  2/17 TUG 8.07 sec  DGI 23/24  EC x 10 sec no problem  2/26: 5x STS 11.22   GAIT:  Comments: decreased gait speed, increased forward trunk flexion, forward head  TREATMENT DATE:08/25/23  Lifting mechanics: pt demo overall good technique, cues for forward gaze and encouraged to keep weight close to the body 4 inch lateral step down with heel touch 2 sets of 5 right/left Standing modified side plank leaning on mat table with left hip flexion 2x10 Single leg RDL 8# WB on right/ left 10x  Modified dead lifts pair of 8# to knee level (cue to push the floor away to rise) 6x2 Quadruped hip extension 2 sets of 5 right/left  (added to HEP) Review of HEP for progressions  TREATMENT DATE:08/23/23  Modified Oswestry Discussion of current ex program and response to treatment Manual therapy: soft tissue mobilization to right lumbar, gluteal and HS musculature Trigger Point Dry Needling Subsequent Treatment: Instructions provided previously at initial dry needling treatment.3 Patient Verbal Consent Given: Yes Education Handout Provided: Yes Muscles Treated:   right lumbar multifidi,right gluteals, right piriformis; right prox HS Electrical Stimulation Performed: Yes, Parameters: 1.5 ma 80 pps 12 min 2 channels Treatment Response/Outcome: decreased pain Utilized skilled palpation to identify bony landmarks and trigger points.  Able to illicit twitch response and muscle elongation.  Soft tissue mobilization to R lumbar and gluteals following to further promote tissue elongation and decreased pain.      TREATMENT DATE:08/18/23  Wall isometrics: hip abduction into ball 5 sec hold 5x right/left (added to HEP) Wall isometric: hip extension into wall 5 sec hold 5x right/left (added to HEP) Single leg RDL 6# WB on right  SLS with 6# left hip flexion 10x 2 sets (added to HEP) 2 inch lateral step ups 5 sec hold 10x right only (added to HEP)  TREATMENT DATE:08/16/23  Discussion of current status Discussed holding off on bike riding due to irritability of proximal HS and gluteals Resisted walking fwd/bwd 10# x 10 ea Prone glute squeeze 10x 5 sec hold Manual therapy: soft tissue mobilization to right lumbar, gluteal and HS musculature Trigger Point Dry Needling Subsequent Treatment: Instructions provided previously at initial dry needling treatment.3 Patient Verbal Consent Given: Yes Education Handout Provided: Yes Muscles Treated:  right lumbar multifidi,right gluteals, right piriformis; right prox HS Electrical Stimulation Performed: Yes, Parameters: 1.5 ma 80 pps 8 min 2 channels Treatment Response/Outcome: decreased pain Utilized skilled palpation to identify bony landmarks and trigger points.  Able to illicit twitch response and muscle elongation.  Soft tissue mobilization to R lumbar and gluteals following to further promote tissue elongation and decreased pain.  PATIENT EDUCATION:  Education details: Educated patient on anatomy and physiology of current symptoms, prognosis, plan of care as well as initial self  care strategies to promote recovery Person educated: Patient Education method: Explanation Education comprehension: verbalized understanding  HOME EXERCISE PROGRAM: Access Code: [email or web address] URL: [email or web address] Date: 08/25/2023 Prepared by: Lavinia Sharps  Exercises - Lying Prone  - 1 x daily - 7 x weekly - 1 sets - 10 reps - Prone Press Up On Elbows  - 1 x daily - 7 x weekly - 1 sets - 5 reps - Prone Press Up  - 1 x daily - 7 x weekly - 1 sets - 5-10 reps - Supine March  - 1 x daily - 7 x weekly - 1 sets - 8-10 reps - Hooklying Isometric Hip Flexion with Opposite Arm  - 1 x daily - 7 x weekly - 1 sets - 8-10 reps - 5 hold - Hooklying Clamshell with Resistance  - 1 x daily - 7 x weekly - 1 sets - 8-10 reps - 5 hold - Supine Double Knee to Chest  - 2 x daily - 7 x weekly - 1 sets - 1-10 reps - 0-20 sec hold - Standing Hip Abduction with Counter Support  - 1 x daily - 3 x weekly - 2 sets - 10 reps - Standing Isometric Hip Abduction with Ball on Wall  - 1 x daily - 7 x weekly - 1 sets - 5 reps - 5 hold - Standing Hip Extension with Counter Support  - 1 x daily - 7 x weekly - 1 sets - 5 reps - 5 hold - Lateral Step Up with Counter Support  - 1 x daily - 7 x weekly - 1 sets - 10 reps - Forward T with Counter Support  - 1 x daily - 7 x weekly - 1 sets - 10 reps - Beginner Front Arm Support  - 1 x daily - 7 x weekly - 2 sets - 5 reps ASSESSMENT:  CLINICAL IMPRESSION: Symptoms continue to decrease in intensity even after prolonged sitting/driving in the last 2 days. Therapist progressing and updating HEP for increased intensity and challenge level for further strengthening and functional mobility.   Instruction given in hip hinging /dead lift technique needed for safe lifting capacity of household items and for posterior chain strengthening of lumbar and lower extremity musculature.  Verbal cues to keep the weight close to the body, hip and shoulders to rise together and for  forward gaze needed for neutral spine alignment.         OBJECTIVE IMPAIRMENTS: decreased activity tolerance, difficulty walking, decreased ROM, decreased strength, impaired perceived functional ability, increased muscle spasms, impaired flexibility, postural dysfunction, and pain.   ACTIVITY LIMITATIONS: lifting, sitting, standing, sleeping, locomotion level, and caring for others  PARTICIPATION LIMITATIONS: meal prep, cleaning, laundry, interpersonal relationship, and community activity  PERSONAL FACTORS: 1-2 comorbidities: HTN, osteopenia  are also affecting patient's functional outcome.   REHAB POTENTIAL: Good  CLINICAL DECISION MAKING: Stable/uncomplicated  EVALUATION COMPLEXITY: Low   GOALS: Goals reviewed with patient? Yes  SHORT TERM GOALS: Target date: 08/02/2023    The patient will demonstrate knowledge of basic self care strategies and exercises to promote healing  Baseline: Goal status: met 2/26  2.  The patient will report a 30% improvement in pain levels with functional activities which are currently difficult including walking, sitting Baseline:  Goal status: met 2/25 3.  The patient will have improved gait stamina and speed  needed to ambulate 800 feet in 6 minutes  Baseline:  Goal status: met 2/25  4.  5x STS improved to 13 sec indicating improved LE strength Baseline:  Goal status: met 2/25    LONG TERM GOALS: Target date: 08/30/2023   The patient will be independent in a safe self progression of a home exercise program to promote further recovery of function  Baseline:  Goal status: ongoing  2.  The patient will report a 70% improvement in pain levels with functional activities which are currently difficult including walking, standing, sitting Baseline:  Goal status: ongoing  3.  The patient will be able to walk 1/4 to 1/2 mile with pain level < 5/10 Baseline:  Goal status: met 3/18  4.  The patient will have improved trunk flexor and  extensor muscle strength to at least 4+/5 needed for lifting medium weight objects such as bird seed and deer corn bags Baseline:  Goal status: met 3/20 5.  Modified Oswestry score improved to 32% indicating improved function with less pain Baseline:  Goal status: met 3/18  PLAN:  PT FREQUENCY: 2x/week  PT DURATION: 8 weeks  PLANNED INTERVENTIONS: 97164- PT Re-evaluation, 97110-Therapeutic exercises, 97530- Therapeutic activity, 97112- Neuromuscular re-education, 97535- Self Care, 96295- Manual therapy, [id number]- Aquatic Therapy, 97014- Electrical stimulation (unattended), [id number]- Electrical stimulation (manual), [id number]- Ultrasound, [id number]- Traction (mechanical), [id number]- Ionotophoresis 4mg /ml Dexamethasone, Patient/Family education, Taping, Dry Needling, Joint mobilization, Spinal manipulation, Spinal mobilization, Cryotherapy, and Moist heat.  PLAN FOR NEXT SESSION:ERO next visit; moving husband into facility this weekend; right gluteal strengthening;  DN combined with ES; continue core strength monitoring numbness sx  Lavinia Sharps, PT 08/25/23 12:40 PM Phone: [phone] Fax: [phone]

## 2023-08-26 ENCOUNTER — Encounter: Payer: Self-pay | Admitting: Cardiology

## 2023-08-26 DIAGNOSIS — E782 Mixed hyperlipidemia: Secondary | ICD-10-CM

## 2023-08-30 ENCOUNTER — Other Ambulatory Visit: Payer: Self-pay

## 2023-08-30 ENCOUNTER — Ambulatory Visit: Payer: Medicare PPO | Admitting: Physical Therapy

## 2023-08-30 DIAGNOSIS — M6281 Muscle weakness (generalized): Secondary | ICD-10-CM | POA: Diagnosis not present

## 2023-08-30 DIAGNOSIS — G8929 Other chronic pain: Secondary | ICD-10-CM | POA: Diagnosis not present

## 2023-08-30 DIAGNOSIS — E782 Mixed hyperlipidemia: Secondary | ICD-10-CM | POA: Diagnosis not present

## 2023-08-30 DIAGNOSIS — M5441 Lumbago with sciatica, right side: Secondary | ICD-10-CM | POA: Diagnosis not present

## 2023-08-30 LAB — LIPID PANEL
Chol/HDL Ratio: 3.2 ratio (ref 0.0–4.4)
Cholesterol, Total: 208 mg/dL — ABNORMAL HIGH (ref 100–199)
HDL: 66 mg/dL (ref >39)
LDL Chol Calc (NIH): 130 mg/dL — ABNORMAL HIGH (ref 0–99)
Triglycerides: 65 mg/dL (ref 0–149)
VLDL Cholesterol Cal: 12 mg/dL (ref 5–40)

## 2023-08-30 NOTE — Therapy (Signed)
 OUTPATIENT PHYSICAL THERAPY THORACOLUMBAR PROGRESS NOTE/RECERTIFICATION   Patient Name: Kristi Alvarado MRN: [id number] DOB:10-24-1950, 73 y.o., female Today's Date: 08/30/2023   END OF SESSION:  PT End of Session - 08/30/23 1059     Visit Number 12    Number of Visits 16    Date for PT Re-Evaluation 10/25/23    Authorization Type [employer] 16 visits will submit for re-auth    Authorization - Number of Visits 20    PT Start Time 1100    PT Stop Time 1141    PT Time Calculation (min) 41 min    Activity Tolerance Patient tolerated treatment well                 Past Medical History:  Diagnosis Date   Allergy    Hypertension    Past Surgical History:  Procedure Laterality Date   BREAST BIOPSY Right    removal benign adenoma right breast   BTL     COLONOSCOPY  10 yrs ago    [place] [facility] exam"   Patient Active Problem List   Diagnosis Date Noted   Primary hypertension 04/18/2023   Hyperlipidemia 10/26/2022   Routine general medical examination at a health care facility 10/31/2014    PCP: Hillard Danker MD  REFERRING PROVIDER: Richardean Sale DO  REFERRING DIAG: B14.78, G89.29 chronic bil low back pain with right sided sciatica  Rationale for Evaluation and Treatment: Rehabilitation  THERAPY DIAG:  Back pain; weakness  ONSET DATE: end of October  SUBJECTIVE:                                                                                                                                                                                           SUBJECTIVE STATEMENT: Went to the gym and did upper body, walked on the treadmill for 5 min.  Hasn't tried Lower body ex's at the gym (thinks leg press, trunk extension, calf press, leg extension and hamstring aggravated maybe started this). Walked 1 mile Sunday conquered hills.  Pain has been good. I think the needling helps tremendously.  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 Chronic bilateral low back pain with right-sided sciatica 2. Degeneration of intervertebral disc of lumbar region with discogenic back pain and lower extremity pain 3. Greater trochanteric bursitis of right hip 4. Muscle strain of right gluteal region, subsequent encounter  -Chronic with exacerbation, complicated, subsequent visit - Complicated presentation that is likely multifactorial including lumbar stenosis with radiculopathy and greater trochanteric bursitis/gluteal tendinitis.  Patient continues to experience right posterior hip pain.  Patient did have significant improvement for 4 weeks after greater trochanteric/gluteal tendon insertion CSI performed on 05/17/2023, however all pain returned restarting physical activity.  Discussed that we hope for 3 months or greater relief from CSI - Recommend epidural right sided L4-L5 to evaluate if lumbar stenosis is a primary contributing factor to patient's pain.  Order placed - Patient elected for IM injection of methylprednisone 80 mg/Toradol 60 mg.  Injection given in clinic today and tolerated well. PERTINENT HISTORY:  HTN; mild high BP  PAIN:  Are you having pain? Yes NPRS scale:1-2/10  tenderness in back/sacral, numbness down the leg  Pain location: tender right low back; numbness right post/lateral and anteriorthigh, right anterior shin spasms, tingling and numbness constant Pain orientation: Right   PAIN TYPE: burning and tingling Pain description: constant and tingling  Aggravating factors: walk fast; lying on left side; sitting Relieving factors: after meds for 2 hours; right sidelying; take some weight off right leg; tested on Sunday and walked 1/4 mile felt better   PRECAUTIONS: None   WEIGHT BEARING RESTRICTIONS: No  FALLS:  Has patient fallen in last 6 months? No  LIVING ENVIRONMENT: Lives with: lives with their spouse Lives in: House/ap[address] and older community  Stairs: No  OCCUPATION: retired  Human resources officer: running;   PLOF: Independent takes care of husband (Parkinsons) tries to limit lifting PATIENT GOALS: walk without pain, walk long distances, later return to running; be able to lift 40# bag of birdseed or 50# bag of deer corn (drags)   NEXT MD VISIT: next Tuesday 2/4 OBJECTIVE:  Note: Objective measures were completed at Evaluation unless otherwise noted.  DIAGNOSTIC FINDINGS:  MRI December 2024 IMPRESSION: 1. Severe L4-L5 facet arthrosis with moderate spinal canal stenosis and severe right neural foraminal stenosis. Round, low signal structure extending into the right neural foramen may be a sequestered disc fragment or synovial cyst. 2. Mild L3-L4 spinal canal stenosis.  Bone Density:  mild osteopenia femur? (not in Epic pt to look up findings)  PATIENT SURVEYS:  Modified Oswestry 44%  3/18:  22%   COGNITION: Overall cognitive status: Within functional limits for tasks assessed     SENSATION: Light touch: Impaired right thigh and anterior lower leg  MUSCLE LENGTH: Decreased right hip flexor length in sidelying and prone POSTURE: decreased lumbar lordosis and right iliac crest higher than left; moderate to severe left ankle pronation  PALPATION: Marked tenderness and spasm in right gluteals and piriformis  LUMBAR ROM:  DKTC mild increase but no worse ; prone lying in hip/buttock ; POE less pain; press up less tingling in leg?   AROM  eval 2/26 3/25  Flexion 25% limited WFLs Full no pain  Extension 25% limited WFLs Full no pain  Right lateral flexion 10% limited WFLs Full no pain  Left lateral flexion 10% limited WFLs Full no pain  Right rotation     Left rotation      (Blank rows = not tested)  TRUNK STRENGTH:  Decreased activation of transverse abdominus muscles; abdominals 4-/5; decreased activation of lumbar multifidi; trunk extensors 4-/5 3/18: trunk strength 4/5 3/25:  trunk strength 4/5  LOWER EXTREMITY ROM:   20% reduction in right hip mobility LOWER EXTREMITY MMT:  Able to rise from standard chair without UE assist  MMT Right eval Left eval 3/18 Right 3/25  Hip flexion 4 5 R 4 R 4  Hip extension 4 5 R 4 R 4  Hip abduction 3+ 5 R 4- R 4  Hip adduction      Hip internal rotation      Hip external rotation 4 5    Knee flexion 4 5 4+ 4+  Knee extension 4 5 4+ 4+  Ankle dorsiflexion 3+ 3+ 4 4+  Ankle plantarflexion 3+ 3+ 4 4+  Ankle inversion      Ankle eversion       (Blank rows = not tested)  LUMBAR SPECIAL TESTS:  Negative slump and SLR but + neural tension   FUNCTIONAL TESTS:  SLS 2 sec right/left 5x STS 15.18 sec no UE use 3 MWT 443 feet  pain 8/10 following, increased numbness/tingling  2/17 TUG 8.07 sec  DGI 23/24  EC x 10 sec no problem  2/26: 5x STS 11.22   GAIT:  Comments: decreased gait speed, increased forward trunk flexion, forward head TREATMENT DATE:08/30/23  Discussion of gym program: Add on 1 leg ex  per week at half the load/weight as previously performed, low reps (start with 10 reps) SLS Lumbar ROM Goal setting Manual therapy: soft tissue mobilization to right lumbar, gluteal and HS musculature Trigger Point Dry Needling Subsequent Treatment: Instructions provided previously at initial dry needling treatment.3 Patient Verbal Consent Given: Yes Education Handout Provided: Yes Muscles Treated:  right lumbar multifidi,right gluteals, right piriformis; right prox  HS Electrical Stimulation Performed: Yes, Parameters: 1.5 ma 80 pps 10 min 2 channels Treatment Response/Outcome: decreased pain Utilized skilled palpation to identify bony landmarks and trigger points.  Able to illicit twitch response and muscle elongation.  Soft tissue mobilization to R  TREATMENT DATE:08/25/23  Lifting mechanics: pt demo overall good technique, cues for forward gaze and encouraged to keep weight close to the body 4 inch lateral step down with heel touch 2 sets of 5 right/left Standing modified side plank leaning on mat table with left hip flexion 2x10 Single leg RDL 8# WB on right/ left 10x  Modified dead lifts pair of 8# to knee level (cue to push the floor away to rise) 6x2 Quadruped hip extension 2 sets of 5 right/left  (added to HEP) Review of HEP for progressions  TREATMENT DATE:08/23/23  Modified Oswestry Discussion of current ex program and response to treatment Manual therapy: soft tissue mobilization to right lumbar, gluteal and HS musculature Trigger Point Dry Needling Subsequent Treatment: Instructions provided previously at initial dry needling treatment.3 Patient Verbal Consent Given: Yes Education Handout Provided: Yes Muscles Treated:  right lumbar multifidi,right gluteals, right piriformis; right prox HS Electrical Stimulation Performed: Yes, Parameters: 1.5 ma 80 pps 12 min 2 channels Treatment Response/Outcome: decreased pain Utilized skilled palpation to identify bony landmarks and trigger points.  Able to illicit twitch response and muscle elongation.  Soft tissue mobilization to R lumbar and gluteals following to further promote tissue elongation and decreased pain.      TREATMENT DATE:08/18/23  Wall isometrics: hip abduction into ball 5 sec hold 5x right/left (added to HEP) Wall isometric: hip extension into wall 5 sec hold 5x right/left (added to  HEP) Single leg RDL 6# WB on right  SLS with 6# left hip flexion 10x 2 sets (added to HEP) 2 inch  lateral step ups 5 sec hold 10x right only (added to HEP)  TREATMENT DATE:08/16/23  Discussion of current status Discussed holding off on bike riding due to irritability of proximal HS and gluteals Resisted walking fwd/bwd 10# x 10 ea Prone glute squeeze 10x 5 sec hold Manual therapy: soft tissue mobilization to right lumbar, gluteal and HS musculature Trigger Point Dry Needling Subsequent Treatment: Instructions provided previously at initial dry needling treatment.3 Patient Verbal Consent Given: Yes Education Handout Provided: Yes Muscles Treated:  right lumbar multifidi,right gluteals, right piriformis; right prox HS Electrical Stimulation Performed: Yes, Parameters: 1.5 ma 80 pps 8 min 2 channels Treatment Response/Outcome: decreased pain Utilized skilled palpation to identify bony landmarks and trigger points.  Able to illicit twitch response and muscle elongation.  Soft tissue mobilization to R lumbar and gluteals following to further promote tissue elongation and decreased pain.                                      PATIENT EDUCATION:  Education details: Educated patient on anatomy and physiology of current symptoms, prognosis, plan of care as well as initial self care strategies to promote recovery Person educated: Patient Education method: Explanation Education comprehension: verbalized understanding  HOME EXERCISE PROGRAM: Access Code: [email or web address] URL: [email or web address] Date: 08/25/2023 Prepared by: Lavinia Sharps  Exercises - Lying Prone  - 1 x daily - 7 x weekly - 1 sets - 10 reps - Prone Press Up On Elbows  - 1 x daily - 7 x weekly - 1 sets - 5 reps - Prone Press Up  - 1 x daily - 7 x weekly - 1 sets - 5-10 reps - Supine March  - 1 x daily - 7 x weekly - 1 sets - 8-10 reps - Hooklying Isometric Hip Flexion with Opposite Arm  - 1 x daily - 7 x weekly - 1 sets - 8-10 reps - 5 hold - Hooklying Clamshell with Resistance  - 1 x daily - 7 x weekly - 1 sets -  8-10 reps - 5 hold - Supine Double Knee to Chest  - 2 x daily - 7 x weekly - 1 sets - 1-10 reps - 0-20 sec hold - Standing Hip Abduction with Counter Support  - 1 x daily - 3 x weekly - 2 sets - 10 reps - Standing Isometric Hip Abduction with Ball on Wall  - 1 x daily - 7 x weekly - 1 sets - 5 reps - 5 hold - Standing Hip Extension with Counter Support  - 1 x daily - 7 x weekly - 1 sets - 5 reps - 5 hold - Lateral Step Up with Counter Support  - 1 x daily - 7 x weekly - 1 sets - 10 reps - Forward T with Counter Support  - 1 x daily - 7 x weekly - 1 sets - 10 reps - Beginner Front Arm Support  - 1 x daily - 7 x weekly - 2 sets - 5 reps ASSESSMENT:  CLINICAL IMPRESSION: Decreased pain in back and LE pain/numbness.  Symptoms still present but lower in intensity.  She has been able to increase her walking distance including some hill walking but not the amount prior to this issue.  She has returned to the  gym but has not attempted lower body or back ex machines since that previously caused a flare up.  The patient would benefit from a continuation of skilled PT for a further progression of strengthening and functional mobility.  Will continue to update and promote independence in a HEP needed for a return to the highest functional level possible with ADLs.  Decrease treatment frequency to 1x/week to promote self efficacy.       OBJECTIVE IMPAIRMENTS: decreased activity tolerance, difficulty walking, decreased ROM, decreased strength, impaired perceived functional ability, increased muscle spasms, impaired flexibility, postural dysfunction, and pain.   ACTIVITY LIMITATIONS: lifting, sitting, standing, sleeping, locomotion level, and caring for others  PARTICIPATION LIMITATIONS: meal prep, cleaning, laundry, interpersonal relationship, and community activity  PERSONAL FACTORS: 1-2 comorbidities: HTN, osteopenia  are also affecting patient's functional outcome.   REHAB POTENTIAL: Good  CLINICAL  DECISION MAKING: Stable/uncomplicated  EVALUATION COMPLEXITY: Low   GOALS: Goals reviewed with patient? Yes  SHORT TERM GOALS: Target date: 08/02/2023    The patient will demonstrate knowledge of basic self care strategies and exercises to promote healing  Baseline: Goal status: met 2/26  2.  The patient will report a 30% improvement in pain levels with functional activities which are currently difficult including walking, sitting Baseline:  Goal status: met 2/25 3.  The patient will have improved gait stamina and speed needed to ambulate 800 feet in 6 minutes  Baseline:  Goal status: met 2/25  4.  5x STS improved to 13 sec indicating improved LE strength Baseline:  Goal status: met 2/25    LONG TERM GOALS: Target date: 10/25/2023   The patient will be independent in a safe self progression of a home exercise program to promote further recovery of function  Baseline:  Goal status: ongoing  2.  The patient will report a 70% improvement in pain levels with functional activities which are currently difficult including walking, standing, sitting Baseline:  Goal status: ongoing  3.  The patient will be able to walk 1/4 to 1/2 mile with pain level < 5/10 Baseline:  Goal status: met 3/18  4.  The patient will have improved trunk flexor and extensor muscle strength to at least 4+/5 needed for lifting medium weight objects such as bird seed and deer corn bags Baseline:  Goal status: met 3/20 5.  Modified Oswestry score improved to 32% indicating improved function with less pain Baseline:  Goal status: met 3/18  6. Be able to walk 1 mile in 30-35 minutes NEW  7. Strength needed for gardening with minimal to no pain, including lifting  NEW  8. Vacumning /dusting with minimal to no pain NEW   PLAN:  PT FREQUENCY: 1x/week  PT DURATION: 8 weeks  PLANNED INTERVENTIONS: 97164- PT Re-evaluation, 97110-Therapeutic exercises, 97530- Therapeutic activity, 97112-  Neuromuscular re-education, 97535- Self Care, 82956- Manual therapy, [id number]- Aquatic Therapy, 97014- Electrical stimulation (unattended), [id number]- Electrical stimulation (manual), [id number]- Ultrasound, [id number]- Traction (mechanical), [id number]- Ionotophoresis 4mg /ml Dexamethasone, Patient/Family education, Taping, Dry Needling, Joint mobilization, Spinal manipulation, Spinal mobilization, Cryotherapy, and Moist heat.  PLAN FOR NEXT SESSION: right gluteal strengthening;  DN combined with ES; continue core strength monitoring numbness sx; decreases treatment to 1x/week  Lavinia Sharps, PT 08/30/23 12:51 PM Phone: [phone] Fax: [phone]

## 2023-09-01 ENCOUNTER — Ambulatory Visit: Payer: Medicare PPO | Attending: Cardiology | Admitting: Cardiology

## 2023-09-01 ENCOUNTER — Encounter: Payer: Self-pay | Admitting: Cardiology

## 2023-09-01 VITALS — BP 156/80 | HR 61 | Ht 63.0 in | Wt 137.4 lb

## 2023-09-01 DIAGNOSIS — I10 Essential (primary) hypertension: Secondary | ICD-10-CM | POA: Diagnosis not present

## 2023-09-01 DIAGNOSIS — E782 Mixed hyperlipidemia: Secondary | ICD-10-CM | POA: Diagnosis not present

## 2023-09-01 MED ORDER — AMLODIPINE BESYLATE 5 MG PO TABS: 5.0000 mg | ORAL_TABLET | Freq: Every day | ORAL | 3 refills | Status: AC

## 2023-09-01 NOTE — Progress Notes (Signed)
 Cardiology Office Note:    Date:  09/04/2023   ID:  Kristi Alvarado, DOB [birth year], MRN [id number]  PCP:  Myrlene Broker, MD  Cardiologist:  Thomasene Ripple, DO  Electrophysiologist:  None   Referring MD: Myrlene Broker, *   " I am ok"  History of Present Illness:    Kristi Alvarado is a 73 y.o. female with a hx of elevated coronary calcium, Activon score is 333, hypertension, hyperlipidemia , elevated coronary calcium score here for follow-up visit.   Since her last visit with me she tells me that  her pain is 90% better and is now controlled. She has been attending physical therapy and has found dry needling to be particularly beneficial. She has also been able to increase her physical activity, recently walking a mile. However, she expresses concern about her blood pressure and lab work, specifically her LDL levels. She has been taking amlodipine daily and has noticed some improvement. She also mentions a significant life change, as her husband has recently been placed in a skilled facility. She expresses feelings of guilt about this but acknowledges that it is the best decision for both of them.   Past Medical History:  Diagnosis Date   Allergy    Hypertension     Past Surgical History:  Procedure Laterality Date   BREAST BIOPSY Right    removal benign adenoma right breast   BTL     COLON[facility]  10 yrs ago    [place] [facility]"    Current Medications: Current Meds  Medication Sig   amLODipine (NORVASC) 5 MG tablet Take 1 tablet (5 mg total) by mouth daily.   BIOTIN 5000 PO Take 1 tablet by mouth daily.   clonazePAM (KLONOPIN) 0.5 MG tablet Take 0.5 mg by mouth at bedtime as needed.   fexofenadine (ALLEGRA) 180 MG tablet Take 1 tablet by mouth daily.   fluticasone (FLONASE) 50 MCG/ACT nasal spray Place 1 spray into the nose daily.   glucosamine-chondroitin 500-400 MG tablet Take 1 tablet by mouth 3 (three) times daily.   NON FORMULARY Take 1-3  capsules by mouth 2 (two) times daily. Patient takes 2 capsules in the morning and 1 at night   PRESCRIPTION MEDICATION Regenereyes 1drop left eye twice daily   sertraline (ZOLOFT) 50 MG tablet sertraline 50 mg tablet  TAKE 1 TABLET BY MOUTH EVERY DAY FOR 30 DAYS     Allergies:   Patient has no known allergies.   Social History   Socioeconomic History   Marital status: Married    Spouse name: Not on file   Number of children: Not on file   Years of education: Not on file   Highest education level: Not on file  Occupational History   Not on file  Tobacco Use   Smoking status: Never   Smokeless tobacco: Never  Vaping Use   Vaping status: Never Used  Substance and Sexual Activity   Alcohol use: Yes    Alcohol/week: 0.0 standard drinks of alcohol    Comment: social/maybe 2 times per month per pt   Drug use: No   Sexual activity: Not on file  Other Topics Concern   Not on file  Social History Narrative   Not on file   Social Drivers of Health   Financial Resource Strain: Low Risk  (10/11/2022)   Overall Financial Resource Strain (CARDIA)    Difficulty of Paying Living Expenses: Not hard at all  Food Insecurity: No Food Insecurity (  10/11/2022)   Hunger Vital Sign    Worried About Running Out of Food in the Last Year: Never true    Ran Out of Food in the Last Year: Never true  Transportation Needs: No Transportation Needs (10/11/2022)   PRAPARE - Administrator, Civil Service (Medical): No    Lack of Transportation (Non-Medical): No  Physical Activity: Sufficiently Active (10/11/2022)   Exercise Vital Sign    Days of Exercise per Week: 5 days    Minutes of Exercise per Session: 60 min  Stress: No Stress Concern Present (10/11/2022)   [employer] of Occupational Health - Occupational Stress Questionnaire    Feeling of Stress : Not at all  Social Connections: Unknown (10/11/2022)   Social Connection and Isolation Panel [NHANES]    Frequency of Communication with  Friends and Family: More than three times a week    Frequency of Social Gatherings with Friends and Family: Once a week    Attends Religious Services: Patient declined    Database administrator or Organizations: Patient declined    Attends Banker Meetings: Patient declined    Marital Status: Married     Family History: The patient's family history includes Cancer in her maternal grandmother; Colon cancer in her maternal grandmother; Heart disease in her father and mother. There is no history of Esophageal cancer, Stomach cancer, or Breast cancer.  ROS:   Review of Systems  Constitution: Negative for decreased appetite, fever and weight gain.  HENT: Negative for congestion, ear discharge, hoarse voice and sore throat.   Eyes: Negative for discharge, redness, vision loss in right eye and visual halos.  Cardiovascular: Negative for chest pain, dyspnea on exertion, leg swelling, orthopnea and palpitations.  Respiratory: Negative for cough, hemoptysis, shortness of breath and snoring.   Endocrine: Negative for heat intolerance and polyphagia.  Hematologic/Lymphatic: Negative for bleeding problem. Does not bruise/bleed easily.  Skin: Negative for flushing, nail changes, rash and suspicious lesions.  Musculoskeletal: Negative for arthritis, joint pain, muscle cramps, myalgias, neck pain and stiffness.  Gastrointestinal: Negative for abdominal pain, bowel incontinence, diarrhea and excessive appetite.  Genitourinary: Negative for decreased libido, genital sores and incomplete emptying.  Neurological: Negative for brief paralysis, focal weakness, headaches and loss of balance.  Psychiatric/Behavioral: Negative for altered mental status, depression and suicidal ideas.  Allergic/Immunologic: Negative for HIV exposure and persistent infections.    EKGs/Labs/Other Studies Reviewed:    The following studies were reviewed today:   EKG:  The ekg ordered today demonstrates sinus  rhythm  Recent Labs: No results found for requested labs within last 365 days.  Recent Lipid Panel    Component Value Date/Time   CHOL 208 (H) 08/30/2023 0944   TRIG 65 08/30/2023 0944   HDL 66 08/30/2023 0944   CHOLHDL 3.2 08/30/2023 0944   CHOLHDL 3 03/22/2016 1532   VLDL 26.4 03/22/2016 1532   LDLCALC 130 (H) 08/30/2023 0944    Physical Exam:    VS:  BP (!) 156/80 (BP Location: Right Arm, Patient Position: Sitting, Cuff Size: Normal)   Pulse 61   Ht 5\' 3"  (1.6 m)   Wt 137 lb 6.4 oz (62.3 kg)   SpO2 99%   BMI 24.34 kg/m     Wt Readings from Last 3 Encounters:  09/01/23 137 lb 6.4 oz (62.3 kg)  08/02/23 137 lb (62.1 kg)  07/12/23 137 lb (62.1 kg)     GEN: Well nourished, well developed in no acute distress HEENT:  Normal NECK: No JVD; No carotid bruits LYMPHATICS: No lymphadenopathy CARDIAC: S1S2 noted,RRR, no murmurs, rubs, gallops RESPIRATORY:  Clear to auscultation without rales, wheezing or rhonchi  ABDOMEN: Soft, non-tender, non-distended, +bowel sounds, no guarding. EXTREMITIES: No edema, No cyanosis, no clubbing MUSCULOSKELETAL:  No deformity  SKIN: Warm and dry NEUROLOGIC:  Alert and oriented x 3, non-focal PSYCHIATRIC:  Normal affect, good insight  ASSESSMENT:    1. Primary hypertension   2. Mixed hyperlipidemia     PLAN:    Hypertension Hypertension not well-controlled. BP 156/112. Current amlodipine dose insufficient. Dose increase needed to avoid hypotension and falls. - Increase amlodipine to 5 mg daily. - Instruct her to take two pills of current dose until new prescription is filled. - Schedule follow-up appointment in six weeks with the pharmacist.  Hyperlipidemia LDL cholesterol level is 88, within target range.  Chronic Pain Chronic pain improved by 90%. Physical therapy effective. - Continue physical therapy sessions.  Exercise and Diet Resumed exercise and improved diet. Positive impact on health expected. - Encourage  continuation of exercise and healthy eating habits.   The patient is in agreement with the above plan. The patient left the office in stable condition.  The patient will follow up in   Medication Adjustments/Labs and Tests Ordered: Current medicines are reviewed at length with the patient today.  Concerns regarding medicines are outlined above.  Orders Placed This Encounter  Procedures   EKG 12-Lead   Meds ordered this encounter  Medications   amLODipine (NORVASC) 5 MG tablet    Sig: Take 1 tablet (5 mg total) by mouth daily.    Dispense:  90 tablet    Refill:  3    Patient Instructions  Medication Instructions:  Your physician has recommended you make the following change in your medication:  INCREASE: Amlodipine 5 mg once daily  Please see our pharmacy staff or an APP in 6 weeks for medication/blood pressure follow up.  *If you need a refill on your cardiac medications before your next appointment, please call your pharmacy* Follow-Up: At [facility], you and your health needs are our priority.  As part of our continuing mission to provide you with exceptional heart care, we have created designated Provider Care Teams.  These Care Teams include your primary Cardiologist (physician) and Advanced Practice Providers (APPs -  Physician Assistants and Nurse Practitioners) who all work together to provide you with the care you need, when you need it.  We recommend signing up for the patient portal called "MyChart".  Sign up information is provided on this After Visit Summary.  MyChart is used to connect with patients for Virtual Visits (Telemedicine).  Patients are able to view lab/test results, encounter notes, upcoming appointments, etc.  Non-urgent messages can be sent to your provider as well.   To learn more about what you can do with MyChart, go to [email or web address].    Your next appointment:   16 week(s)  Provider:   Thomasene Ripple, DO     Other  Instructions   1st Floor: - Lobby - Registration  - Pharmacy  - Lab - Cafe  2nd Floor: - PV Lab - Diagnostic Testing (echo, CT, nuclear med)  3rd Floor: - Vacant  4th Floor: - TCTS (cardiothoracic surgery) - AFib Clinic - Structural Heart Clinic - Vascular Surgery  - Vascular Ultrasound  5th Floor: - HeartCare Cardiology (general and EP) - Clinical Pharmacy for coumadin, hypertension, lipid, weight-loss medications, and med management appointments  Valet parking services will be available as well.         Adopting a Healthy Lifestyle.  Know what a healthy weight is for you (roughly BMI <25) and aim to maintain this   Aim for 7+ servings of fruits and vegetables daily   65-80+ fluid ounces of water or unsweet tea for healthy kidneys   Limit to max 1 drink of alcohol per day; avoid smoking/tobacco   Limit animal fats in diet for cholesterol and heart health - choose grass fed whenever available   Avoid highly processed foods, and foods high in saturated/trans fats   Aim for low stress - take time to unwind and care for your mental health   Aim for 150 min of moderate intensity exercise weekly for heart health, and weights twice weekly for bone health   Aim for 7-9 hours of sleep daily   When it comes to diets, agreement about the perfect plan isnt easy to find, even among the experts. Experts at the [facility] [employer] developed an idea known as the Healthy Eating Plate. Just imagine a plate divided into logical, healthy portions.   The emphasis is on diet quality:   Load up on vegetables and fruits - one-half of your plate: Aim for color and variety, and remember that potatoes dont count.   Go for whole grains - one-quarter of your plate: Whole wheat, barley, wheat berries, quinoa, oats, brown rice, and foods made with them. If you want pasta, go with whole wheat pasta.   Protein power - one-quarter of your plate: Fish, chicken, beans, and  nuts are all healthy, versatile protein sources. Limit red meat.   The diet, however, does go beyond the plate, offering a few other suggestions.   Use healthy plant oils, such as olive, canola, soy, corn, sunflower and peanut. Check the labels, and avoid partially hydrogenated oil, which have unhealthy trans fats.   If youre thirsty, drink water. Coffee and tea are good in moderation, but skip sugary drinks and limit milk and dairy products to one or two daily servings.   The type of carbohydrate in the diet is more important than the amount. Some sources of carbohydrates, such as vegetables, fruits, whole grains, and beans-are healthier than others.   Finally, stay active  Signed, Thomasene Ripple, DO  09/04/2023 4:32 AM    [facility]

## 2023-09-01 NOTE — Patient Instructions (Addendum)
 Medication Instructions:  Your physician has recommended you make the following change in your medication:  INCREASE: Amlodipine 5 mg once daily  Please see our pharmacy staff or an APP in 6 weeks for medication/blood pressure follow up.  *If you need a refill on your cardiac medications before your next appointment, please call your pharmacy* Follow-Up: At [facility], you and your health needs are our priority.  As part of our continuing mission to provide you with exceptional heart care, we have created designated Provider Care Teams.  These Care Teams include your primary Cardiologist (physician) and Advanced Practice Providers (APPs -  Physician Assistants and Nurse Practitioners) who all work together to provide you with the care you need, when you need it.  We recommend signing up for the patient portal called "MyChart".  Sign up information is provided on this After Visit Summary.  MyChart is used to connect with patients for Virtual Visits (Telemedicine).  Patients are able to view lab/test results, encounter notes, upcoming appointments, etc.  Non-urgent messages can be sent to your provider as well.   To learn more about what you can do with MyChart, go to [email or web address].    Your next appointment:   16 week(s)  Provider:   Thomasene Ripple, DO     Other Instructions   1st Floor: - Lobby - Registration  - Pharmacy  - Lab - Cafe  2nd Floor: - PV Lab - Diagnostic Testing (echo, CT, nuclear med)  3rd Floor: - Vacant  4th Floor: - TCTS (cardiothoracic surgery) - AFib Clinic - Structural Heart Clinic - Vascular Surgery  - Vascular Ultrasound  5th Floor: - HeartCare Cardiology (general and EP) - Clinical Pharmacy for coumadin, hypertension, lipid, weight-loss medications, and med management appointments    Valet parking services will be available as well.

## 2023-09-07 ENCOUNTER — Ambulatory Visit: Attending: Sports Medicine | Admitting: Physical Therapy

## 2023-09-07 DIAGNOSIS — G8929 Other chronic pain: Secondary | ICD-10-CM | POA: Insufficient documentation

## 2023-09-07 DIAGNOSIS — M5441 Lumbago with sciatica, right side: Secondary | ICD-10-CM | POA: Diagnosis not present

## 2023-09-07 DIAGNOSIS — M6281 Muscle weakness (generalized): Secondary | ICD-10-CM | POA: Diagnosis not present

## 2023-09-07 NOTE — Therapy (Signed)
 OUTPATIENT PHYSICAL THERAPY THORACOLUMBAR PROGRESS NOTE   Patient Name: Kristi Alvarado MRN: [id number] DOB:[birth year], 73 y.o., female Today's Date: 09/07/2023   END OF SESSION:  PT End of Session - 09/07/23 0921     Visit Number 13    Date for PT Re-Evaluation 10/25/23    Authorization Type [employer] additional visits approved 1 Re-eval plus 8 visits 3/26-5/20    Authorization - Visit Number 1    Authorization - Number of Visits 8    Progress Note Due on Visit 20    PT Start Time 0923    PT Stop Time 1005    PT Time Calculation (min) 42 min    Activity Tolerance Patient tolerated treatment well                 Past Medical History:  Diagnosis Date   Allergy    Hypertension    Past Surgical History:  Procedure Laterality Date   BREAST BIOPSY Right    removal benign adenoma right breast   BTL     COLONOSCOPY  10 yrs ago    [place] [facility] exam"   Patient Active Problem List   Diagnosis Date Noted   Primary hypertension 04/18/2023   Hyperlipidemia 10/26/2022   Routine general medical examination at a health care facility 10/31/2014    PCP: Hillard Danker MD  REFERRING PROVIDER: Richardean Sale DO  REFERRING DIAG: N82.95, G89.29 chronic bil low back pain with right sided sciatica  Rationale for Evaluation and Treatment: Rehabilitation  THERAPY DIAG:  Back pain; weakness  ONSET DATE: end of October  SUBJECTIVE:                                                                                                                                                                                           SUBJECTIVE STATEMENT: Things are good.  I sat for a while and I felt it in my back, took an Alleve and it went away. Did knee extension machine produced mild pain proximal HS but not severe.  Went up to the attic several times no problem. Walked a mile no problem, I felt good.   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 Chronic bilateral low back pain with right-sided sciatica 2. Degeneration of intervertebral disc of lumbar region with discogenic back pain and lower extremity pain 3. Greater trochanteric bursitis of right hip 4. Muscle strain of right gluteal region, subsequent encounter  -Chronic with exacerbation, complicated, subsequent visit - Complicated presentation that is likely multifactorial including lumbar stenosis with radiculopathy and greater trochanteric bursitis/gluteal tendinitis.  Patient continues to experience right posterior hip pain.  Patient did have significant improvement for 4 weeks after greater trochanteric/gluteal tendon insertion CSI performed on 05/17/2023, however all pain returned restarting physical activity.  Discussed that we hope for 3 months or greater relief from CSI - Recommend epidural right sided L4-L5 to evaluate if lumbar stenosis is a primary contributing factor to patient's pain.  Order placed - Patient elected for IM injection of methylprednisone 80 mg/Toradol 60 mg.  Injection given in clinic today and tolerated well. PERTINENT HISTORY:  HTN; mild high BP  PAIN:  Are you having pain? Yes NPRS scale:1-2/10  tenderness in back/sacral, numbness down the leg  Pain location: tender right low back; numbness right post/lateral and anteriorthigh, right anterior shin spasms, tingling and numbness constant Pain orientation: Right  PAIN TYPE:  burning and tingling Pain description: constant and tingling  Aggravating factors: walk fast; lying on left side; sitting Relieving factors: after meds for 2 hours; right sidelying; take some weight off right leg; tested on Sunday and walked 1/4 mile felt better   PRECAUTIONS: None   WEIGHT BEARING RESTRICTIONS: No  FALLS:  Has patient fallen in last 6 months? No  LIVING ENVIRONMENT: Lives with: lives with their spouse Lives in: House/apartment [address] and older community  Stairs: No  OCCUPATION: retired  Human resources officer: running;   PLOF: Independent takes care of husband (Parkinsons) tries to limit lifting PATIENT GOALS: walk without pain, walk long distances, later return to running; be able to lift 40# bag of birdseed or 50# bag of deer corn (drags)   NEXT MD VISIT: next Tuesday 2/4 OBJECTIVE:  Note: Objective measures were completed at Evaluation unless otherwise noted.  DIAGNOSTIC FINDINGS:  MRI December 2024 IMPRESSION: 1. Severe L4-L5 facet arthrosis with moderate spinal canal stenosis and severe right neural foraminal stenosis. Round, low signal structure extending into the right neural foramen may be a sequestered disc fragment or synovial cyst. 2. Mild L3-L4 spinal canal stenosis.  Bone Density:  mild osteopenia femur? (not in Epic pt to look up findings)  PATIENT SURVEYS:  Modified Oswestry 44%  3/18:  22%   COGNITION: Overall cognitive status: Within functional limits for tasks assessed     SENSATION: Light touch: Impaired right thigh and anterior lower leg  MUSCLE LENGTH: Decreased right hip flexor length in sidelying and prone POSTURE: decreased lumbar lordosis and right iliac crest higher than left; moderate to severe left ankle pronation  PALPATION: Marked tenderness and spasm in right gluteals and piriformis  LUMBAR ROM:  DKTC mild increase but no worse ; prone lying in hip/buttock ; POE less pain; press up less tingling in leg?   AROM eval 2/26 3/25   Flexion 25% limited WFLs Full no pain  Extension 25% limited WFLs Full no pain  Right lateral flexion 10% limited WFLs Full no pain  Left lateral flexion 10% limited WFLs Full no pain  Right rotation     Left rotation      (  Blank rows = not tested)  TRUNK STRENGTH:  Decreased activation of transverse abdominus muscles; abdominals 4-/5; decreased activation of lumbar multifidi; trunk extensors 4-/5 3/18: trunk strength 4/5 3/25:  trunk strength 4/5  LOWER EXTREMITY ROM:   20% reduction in right hip mobility LOWER EXTREMITY MMT:  Able to rise from standard chair without UE assist  MMT Right eval Left eval 3/18 Right 3/25  Hip flexion 4 5 R 4 R 4  Hip extension 4 5 R 4 R 4  Hip abduction 3+ 5 R 4- R 4  Hip adduction      Hip internal rotation      Hip external rotation 4 5    Knee flexion 4 5 4+ 4+  Knee extension 4 5 4+ 4+  Ankle dorsiflexion 3+ 3+ 4 4+  Ankle plantarflexion 3+ 3+ 4 4+  Ankle inversion      Ankle eversion       (Blank rows = not tested)  LUMBAR SPECIAL TESTS:  Negative slump and SLR but + neural tension   FUNCTIONAL TESTS:  SLS 2 sec right/left 5x STS 15.18 sec no UE use 3 MWT 443 feet  pain 8/10 following, increased numbness/tingling  2/17 TUG 8.07 sec  DGI 23/24  EC x 10 sec no problem  2/26: 5x STS 11.22   GAIT:  Comments: decreased gait speed, increased forward trunk flexion, forward head TREATMENT DATE:09/07/23  Discussion of gym program and response 9# single leg RDL 10x right/left Squats in front of table 9# 10x 9# weight hold at shoulder level with step taps 10x right/left  Manual therapy: soft tissue mobilization to right lumbar, gluteal and HS musculature Trigger Point Dry Needling Subsequent Treatment: Instructions provided previously at initial dry needling treatment.3 Patient Verbal Consent Given: Yes Education Handout Provided: Yes Muscles Treated:  right lumbar multifidi,right gluteals, right piriformis; right prox  HS Electrical Stimulation Performed: Yes, Parameters: 1.5 ma 80 pps 10 min 2 channels Treatment Response/Outcome: decreased pain Utilized skilled palpation to identify bony landmarks and trigger points.  Able to illicit twitch response and muscle elongation.  Soft tissue mobilization to R  TREATMENT DATE:08/30/23  Discussion of gym program: Add on 1 leg ex  per week at half the load/weight as previously performed, low reps (start with 10 reps) SLS Lumbar ROM Goal setting Manual therapy: soft tissue mobilization to right lumbar, gluteal and HS musculature Trigger Point Dry Needling Subsequent Treatment: Instructions provided previously at initial dry needling treatment.3 Patient Verbal Consent Given: Yes Education Handout Provided: Yes Muscles Treated:  right lumbar multifidi,right gluteals, right piriformis; right prox HS Electrical Stimulation Performed: Yes, Parameters: 1.5 ma 80 pps 10 min 2 channels Treatment Response/Outcome: decreased pain Utilized skilled palpation to identify bony landmarks and trigger points.  Able to illicit twitch response and muscle elongation.  Soft tissue mobilization to R  TREATMENT DATE:08/25/23  Lifting mechanics: pt demo overall good technique, cues for forward gaze and encouraged to keep weight close to the body 4 inch lateral step down with heel touch 2 sets of 5 right/left Standing modified side plank leaning on mat table with left hip flexion 2x10 Single leg RDL 8# WB on right/ left 10x  Modified dead lifts pair of 8# to knee level (cue to push the floor away to rise) 6x2 Quadruped hip extension 2 sets of 5 right/left  (added to HEP) Review of HEP for progressions  TREATMENT DATE:08/23/23  Modified Oswestry Discussion of current ex program and response to treatment Manual therapy: soft  tissue mobilization to right lumbar, gluteal and HS musculature Trigger Point Dry Needling Subsequent Treatment: Instructions provided previously at initial dry  needling treatment.3 Patient Verbal Consent Given: Yes Education Handout Provided: Yes Muscles Treated:  right lumbar multifidi,right gluteals, right piriformis; right prox HS Electrical Stimulation Performed: Yes, Parameters: 1.5 ma 80 pps 12 min 2 channels Treatment Response/Outcome: decreased pain Utilized skilled palpation to identify bony landmarks and trigger points.  Able to illicit twitch response and muscle elongation.  Soft tissue mobilization to R lumbar and gluteals following to further promote tissue elongation and decreased pain.      TREATMENT DATE:08/18/23  Wall isometrics: hip abduction into ball 5 sec hold 5x right/left (added to HEP) Wall isometric: hip extension into wall 5 sec hold 5x right/left (added to HEP) Single leg RDL 6# WB on right  SLS with 6# left hip flexion 10x 2 sets (added to HEP) 2 inch lateral step ups 5 sec hold 10x right only (added to HEP)                               PATIENT EDUCATION:  Education details: Educated patient on anatomy and physiology of current symptoms, prognosis, plan of care as well as initial self care strategies to promote recovery Person educated: Patient Education method: Explanation Education comprehension: verbalized understanding  HOME EXERCISE PROGRAM: Access Code: [email or web address] URL: [email or web address] Date: 08/25/2023 Prepared by: Lavinia Sharps  Exercises - Lying Prone  - 1 x daily - 7 x weekly - 1 sets - 10 reps - Prone Press Up On Elbows  - 1 x daily - 7 x weekly - 1 sets - 5 reps - Prone Press Up  - 1 x daily - 7 x weekly - 1 sets - 5-10 reps - Supine March  - 1 x daily - 7 x weekly - 1 sets - 8-10 reps - Hooklying Isometric Hip Flexion with Opposite Arm  - 1 x daily - 7 x weekly - 1 sets - 8-10 reps - 5 hold - Hooklying Clamshell with Resistance  - 1 x daily - 7 x weekly - 1 sets - 8-10 reps - 5 hold - Supine Double Knee to Chest  - 2 x daily - 7 x weekly - 1 sets - 1-10 reps - 0-20 sec hold -  Standing Hip Abduction with Counter Support  - 1 x daily - 3 x weekly - 2 sets - 10 reps - Standing Isometric Hip Abduction with Ball on Wall  - 1 x daily - 7 x weekly - 1 sets - 5 reps - 5 hold - Standing Hip Extension with Counter Support  - 1 x daily - 7 x weekly - 1 sets - 5 reps - 5 hold - Lateral Step Up with Counter Support  - 1 x daily - 7 x weekly - 1 sets - 10 reps - Forward T with Counter Support  - 1 x daily - 7 x weekly - 1 sets - 10 reps - Beginner Front Arm Support  - 1 x daily - 7 x weekly - 2 sets - 5 reps ASSESSMENT:  CLINICAL IMPRESSION: Able to steadily progress strengthening of core and LE musculature with symptoms remaining low in intensity. The patient had numerous muscle twitches produced during dry needling combined with electrical stimulation which is a good prognostic indicator for benefit as it is associated with positive neuromotor and nervous system changes. After session pt states, "That  feels fantastic".     OBJECTIVE IMPAIRMENTS: decreased activity tolerance, difficulty walking, decreased ROM, decreased strength, impaired perceived functional ability, increased muscle spasms, impaired flexibility, postural dysfunction, and pain.   ACTIVITY LIMITATIONS: lifting, sitting, standing, sleeping, locomotion level, and caring for others  PARTICIPATION LIMITATIONS: meal prep, cleaning, laundry, interpersonal relationship, and community activity  PERSONAL FACTORS: 1-2 comorbidities: HTN, osteopenia  are also affecting patient's functional outcome.   REHAB POTENTIAL: Good  CLINICAL DECISION MAKING: Stable/uncomplicated  EVALUATION COMPLEXITY: Low   GOALS: Goals reviewed with patient? Yes  SHORT TERM GOALS: Target date: 08/02/2023    The patient will demonstrate knowledge of basic self care strategies and exercises to promote healing  Baseline: Goal status: met 2/26  2.  The patient will report a 30% improvement in pain levels with functional activities  which are currently difficult including walking, sitting Baseline:  Goal status: met 2/25 3.  The patient will have improved gait stamina and speed needed to ambulate 800 feet in 6 minutes  Baseline:  Goal status: met 2/25  4.  5x STS improved to 13 sec indicating improved LE strength Baseline:  Goal status: met 2/25    LONG TERM GOALS: Target date: 10/25/2023   The patient will be independent in a safe self progression of a home exercise program to promote further recovery of function  Baseline:  Goal status: ongoing  2.  The patient will report a 70% improvement in pain levels with functional activities which are currently difficult including walking, standing, sitting Baseline:  Goal status: ongoing  3.  The patient will be able to walk 1/4 to 1/2 mile with pain level < 5/10 Baseline:  Goal status: met 3/18  4.  The patient will have improved trunk flexor and extensor muscle strength to at least 4+/5 needed for lifting medium weight objects such as bird seed and deer corn bags Baseline:  Goal status: met 3/20 5.  Modified Oswestry score improved to 32% indicating improved function with less pain Baseline:  Goal status: met 3/18  6. Be able to walk 1 mile in 30-35 minutes NEW  7. Strength needed for gardening with minimal to no pain, including lifting  NEW  8. Vacumning /dusting with minimal to no pain NEW   PLAN:  PT FREQUENCY: 1x/week  PT DURATION: 8 weeks  PLANNED INTERVENTIONS: 97164- PT Re-evaluation, 97110-Therapeutic exercises, 97530- Therapeutic activity, 97112- Neuromuscular re-education, 97535- Self Care, [id number]- Manual therapy, [id number]- Aquatic Therapy, 97014- Electrical stimulation (unattended), [id number]- Electrical stimulation (manual), [id number]- Ultrasound, [id number]- Traction (mechanical), [id number]- Ionotophoresis 4mg /ml Dexamethasone, Patient/Family education, Taping, Dry Needling, Joint mobilization, Spinal manipulation, Spinal mobilization, Cryotherapy, and  Moist heat.  PLAN FOR NEXT SESSION: right gluteal strengthening;  DN combined with ES; continue core strength monitoring numbness sx; reduced treatment frequency to 1x/week  Lavinia Sharps, PT 09/07/23 10:23 AM Phone: [phone] Fax: [phone]

## 2023-09-08 ENCOUNTER — Encounter: Payer: Self-pay | Admitting: Cardiology

## 2023-09-14 ENCOUNTER — Ambulatory Visit: Admitting: Physical Therapy

## 2023-09-14 DIAGNOSIS — G8929 Other chronic pain: Secondary | ICD-10-CM

## 2023-09-14 DIAGNOSIS — M6281 Muscle weakness (generalized): Secondary | ICD-10-CM | POA: Diagnosis not present

## 2023-09-14 DIAGNOSIS — M5441 Lumbago with sciatica, right side: Secondary | ICD-10-CM | POA: Diagnosis not present

## 2023-09-14 NOTE — Therapy (Signed)
 OUTPATIENT PHYSICAL THERAPY THORACOLUMBAR PROGRESS NOTE   Patient Name: Kristi Alvarado MRN: [id number] DOB:November 19, 1950, 73 y.o., female Today's Date: 09/14/2023   END OF SESSION:  PT End of Session - 09/14/23 1018     Visit Number 14    Date for PT Re-Evaluation 10/25/23    Authorization Type [employer] additional visits approved 1 Re-eval plus 8 visits 3/26-5/20    Authorization - Visit Number 2    Authorization - Number of Visits 8    Progress Note Due on Visit 20    PT Start Time 1016    PT Stop Time 1055    PT Time Calculation (min) 39 min    Activity Tolerance Patient tolerated treatment well                 Past Medical History:  Diagnosis Date   Allergy    Hypertension    Past Surgical History:  Procedure Laterality Date   BREAST BIOPSY Right    removal benign adenoma right breast   BTL     COLONOSCOPY  10 yrs ago    [place] [facility]"   Patient Active Problem List   Diagnosis Date Noted   Primary hypertension 04/18/2023   Hyperlipidemia 10/26/2022   Routine general medical examination at a health care facility 10/31/2014    PCP: Hillard Danker MD  REFERRING PROVIDER: Richardean Sale DO  REFERRING DIAG: N56.21, G89.29 chronic bil low back pain with right sided sciatica  Rationale for Evaluation and Treatment: Rehabilitation  THERAPY DIAG:  Back pain; weakness  ONSET DATE: end of October  SUBJECTIVE:                                                                                                                                                                                           SUBJECTIVE STATEMENT: I feel great!  Somebody flipped a switch;  did knee extension machine at the gym 3x not overly sore;  doing lots of arm ex's at the gym too and my shoulder is sore from that; I'm going to try the HS machine next time.  Getting in the car and driving to [place] today.    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 Chronic bilateral low back pain with right-sided sciatica 2. Degeneration of intervertebral disc of lumbar region with discogenic back pain and lower extremity pain 3. Greater trochanteric bursitis of right hip 4. Muscle strain of right gluteal region, subsequent encounter  -Chronic with exacerbation, complicated, subsequent visit - Complicated presentation that is likely multifactorial including lumbar stenosis with radiculopathy and greater trochanteric bursitis/gluteal tendinitis.  Patient continues to experience right posterior hip pain.  Patient did have significant improvement for 4 weeks after greater trochanteric/gluteal tendon insertion CSI performed on 05/17/2023, however all pain returned restarting physical activity.  Discussed that we hope for 3 months or greater relief from CSI - Recommend epidural right sided L4-L5 to evaluate if lumbar stenosis is a primary contributing factor to patient's pain.  Order placed - Patient elected for IM injection of methylprednisone 80 mg/Toradol 60 mg.  Injection given in clinic today and tolerated well. PERTINENT HISTORY:  HTN; mild high BP  PAIN:  Are you having pain? Yes NPRS scale:1/10  tenderness in back/sacral, lower buttock Pain location: tender right low back; numbness right post/lateral and anteriorthigh, right anterior shin spasms, tingling and numbness constant Pain orientation:  Right  PAIN TYPE: burning and tingling Pain description: constant and tingling  Aggravating factors: walk fast; lying on left side; sitting Relieving factors: after meds for 2 hours; right sidelying; take some weight off right leg; tested on Sunday and walked 1/4 mile felt better   PRECAUTIONS: None   WEIGHT BEARING RESTRICTIONS: No  FALLS:  Has patient fallen in last 6 months? No  LIVING ENVIRONMENT: Lives with: lives with their spouse Lives in: House/apartment [address] and older community  Stairs: No  OCCUPATION: retired  Human resources officer: running;   PLOF: Independent takes care of husband (Parkinsons) tries to limit lifting PATIENT GOALS: walk without pain, walk long distances, later return to running; be able to lift 40# bag of birdseed or 50# bag of deer corn (drags)   NEXT MD VISIT: next Tuesday 2/4 OBJECTIVE:  Note: Objective measures were completed at Evaluation unless otherwise noted.  DIAGNOSTIC FINDINGS:  MRI December 2024 IMPRESSION: 1. Severe L4-L5 facet arthrosis with moderate spinal canal stenosis and severe right neural foraminal stenosis. Round, low signal structure extending into the right neural foramen may be a sequestered disc fragment or synovial cyst. 2. Mild L3-L4 spinal canal stenosis.  Bone Density:  mild osteopenia femur? (not in Epic pt to look up findings)  PATIENT SURVEYS:  Modified Oswestry 44%  3/18:  22%   COGNITION: Overall cognitive status: Within functional limits for tasks assessed     SENSATION: Light touch: Impaired right thigh and anterior lower leg  MUSCLE LENGTH: Decreased right hip flexor length in sidelying and prone POSTURE: decreased lumbar lordosis and right iliac crest higher than left; moderate to severe left ankle pronation  PALPATION: Marked tenderness and spasm in right gluteals and piriformis  LUMBAR ROM:  DKTC mild increase but no worse ; prone lying in hip/buttock ; POE less pain; press up less tingling in leg?    AROM eval 2/26 3/25  Flexion 25% limited WFLs Full no pain  Extension 25% limited WFLs Full no pain  Right lateral flexion 10% limited WFLs Full no pain  Left lateral flexion 10% limited WFLs Full no pain  Right rotation     Left rotation      (  Blank rows = not tested)  TRUNK STRENGTH:  Decreased activation of transverse abdominus muscles; abdominals 4-/5; decreased activation of lumbar multifidi; trunk extensors 4-/5 3/18: trunk strength 4/5 3/25:  trunk strength 4/5  LOWER EXTREMITY ROM:   20% reduction in right hip mobility LOWER EXTREMITY MMT:  Able to rise from standard chair without UE assist  MMT Right eval Left eval 3/18 Right 3/25  Hip flexion 4 5 R 4 R 4  Hip extension 4 5 R 4 R 4  Hip abduction 3+ 5 R 4- R 4  Hip adduction      Hip internal rotation      Hip external rotation 4 5    Knee flexion 4 5 4+ 4+  Knee extension 4 5 4+ 4+  Ankle dorsiflexion 3+ 3+ 4 4+  Ankle plantarflexion 3+ 3+ 4 4+  Ankle inversion      Ankle eversion       (Blank rows = not tested)  LUMBAR SPECIAL TESTS:  Negative slump and SLR but + neural tension   FUNCTIONAL TESTS:  SLS 2 sec right/left 5x STS 15.18 sec no UE use 3 MWT 443 feet  pain 8/10 following, increased numbness/tingling  2/17 TUG 8.07 sec  DGI 23/24  EC x 10 sec no problem  2/26: 5x STS 11.22   GAIT:  Comments: decreased gait speed, increased forward trunk flexion, forward head TREATMENT DATE:09/14/23  Discussion of gym program and response; recommendations for progression (low resistance/low reps on HS machine, hip abductor/adductor machine) 10# single leg RDL 10x right/left 10# weight hold at shoulder level with marching 10x right/left  Manual therapy: soft tissue mobilization to right lumbar, gluteal and HS musculature Trigger Point Dry Needling Subsequent Treatment: Instructions provided previously at initial dry needling treatment.3 Patient Verbal Consent Given: Yes Education Handout Provided:  Yes Muscles Treated:  right lumbar multifidi,right gluteals, right piriformis; right prox HS Electrical Stimulation Performed: Yes, Parameters: 1.5 ma 80 pps 10 min 2 channels Treatment Response/Outcome: decreased pain Utilized skilled palpation to identify bony landmarks and trigger points.  Able to illicit twitch response and muscle elongation.  Soft tissue mobilization to R  TREATMENT DATE:09/07/23  Discussion of gym program and response 9# single leg RDL 10x right/left Squats in front of table 9# 10x 9# weight hold at shoulder level with step taps 10x right/left  Manual therapy: soft tissue mobilization to right lumbar, gluteal and HS musculature Trigger Point Dry Needling Subsequent Treatment: Instructions provided previously at initial dry needling treatment.3 Patient Verbal Consent Given: Yes Education Handout Provided: Yes Muscles Treated:  right lumbar multifidi,right gluteals, right piriformis; right prox HS Electrical Stimulation Performed: Yes, Parameters: 1.5 ma 80 pps 10 min 2 channels Treatment Response/Outcome: decreased pain Utilized skilled palpation to identify bony landmarks and trigger points.  Able to illicit twitch response and muscle elongation.  Soft tissue mobilization to R  TREATMENT DATE:08/30/23  Discussion of gym program: Add on 1 leg ex  per week at half the load/weight as previously performed, low reps (start with 10 reps) SLS Lumbar ROM Goal setting Manual therapy: soft tissue mobilization to right lumbar, gluteal and HS musculature Trigger Point Dry Needling Subsequent Treatment: Instructions provided previously at initial dry needling treatment.3 Patient Verbal Consent Given: Yes Education Handout Provided: Yes Muscles Treated:  right lumbar multifidi,right gluteals, right piriformis; right prox HS Electrical Stimulation Performed: Yes, Parameters: 1.5 ma 80 pps 10 min 2 channels Treatment Response/Outcome: decreased pain Utilized skilled palpation to  identify bony landmarks and trigger  points.  Able to illicit twitch response and muscle elongation.  Soft tissue mobilization to R                               PATIENT EDUCATION:  Education details: Educated patient on anatomy and physiology of current symptoms, prognosis, plan of care as well as initial self care strategies to promote recovery Person educated: Patient Education method: Explanation Education comprehension: verbalized understanding  HOME EXERCISE PROGRAM: Access Code: [email or web address] URL: [email or web address] Date: 08/25/2023 Prepared by: Lavinia Sharps  Exercises - Lying Prone  - 1 x daily - 7 x weekly - 1 sets - 10 reps - Prone Press Up On Elbows  - 1 x daily - 7 x weekly - 1 sets - 5 reps - Prone Press Up  - 1 x daily - 7 x weekly - 1 sets - 5-10 reps - Supine March  - 1 x daily - 7 x weekly - 1 sets - 8-10 reps - Hooklying Isometric Hip Flexion with Opposite Arm  - 1 x daily - 7 x weekly - 1 sets - 8-10 reps - 5 hold - Hooklying Clamshell with Resistance  - 1 x daily - 7 x weekly - 1 sets - 8-10 reps - 5 hold - Supine Double Knee to Chest  - 2 x daily - 7 x weekly - 1 sets - 1-10 reps - 0-20 sec hold - Standing Hip Abduction with Counter Support  - 1 x daily - 3 x weekly - 2 sets - 10 reps - Standing Isometric Hip Abduction with Ball on Wall  - 1 x daily - 7 x weekly - 1 sets - 5 reps - 5 hold - Standing Hip Extension with Counter Support  - 1 x daily - 7 x weekly - 1 sets - 5 reps - 5 hold - Lateral Step Up with Counter Support  - 1 x daily - 7 x weekly - 1 sets - 10 reps - Forward T with Counter Support  - 1 x daily - 7 x weekly - 1 sets - 10 reps - Beginner Front Arm Support  - 1 x daily - 7 x weekly - 2 sets - 5 reps ASSESSMENT:  CLINICAL IMPRESSION: Kristi Alvarado reports significant improvement in symptom intensity.  She has been able to slowly add back in LE strengthening at the gym.  Slower progression encouraged after a prior set back after using some of  the machines there (leg press in particular). She is able to perform posterior chain and core strengthening today without symptom production.  Excellent response to DN combined with ES to lumbar, gluteal and proximal HS musculature.  Numerous twitch responses which is a good prognostic indicator.      OBJECTIVE IMPAIRMENTS: decreased activity tolerance, difficulty walking, decreased ROM, decreased strength, impaired perceived functional ability, increased muscle spasms, impaired flexibility, postural dysfunction, and pain.   ACTIVITY LIMITATIONS: lifting, sitting, standing, sleeping, locomotion level, and caring for others  PARTICIPATION LIMITATIONS: meal prep, cleaning, laundry, interpersonal relationship, and community activity  PERSONAL FACTORS: 1-2 comorbidities: HTN, osteopenia  are also affecting patient's functional outcome.   REHAB POTENTIAL: Good  CLINICAL DECISION MAKING: Stable/uncomplicated  EVALUATION COMPLEXITY: Low   GOALS: Goals reviewed with patient? Yes  SHORT TERM GOALS: Target date: 08/02/2023    The patient will demonstrate knowledge of basic self care strategies and exercises to promote healing  Baseline: Goal status: met 2/26  2.  The  patient will report a 30% improvement in pain levels with functional activities which are currently difficult including walking, sitting Baseline:  Goal status: met 2/25 3.  The patient will have improved gait stamina and speed needed to ambulate 800 feet in 6 minutes  Baseline:  Goal status: met 2/25  4.  5x STS improved to 13 sec indicating improved LE strength Baseline:  Goal status: met 2/25    LONG TERM GOALS: Target date: 10/25/2023   The patient will be independent in a safe self progression of a home exercise program to promote further recovery of function  Baseline:  Goal status: ongoing  2.  The patient will report a 70% improvement in pain levels with functional activities which are currently difficult  including walking, standing, sitting Baseline:  Goal status: ongoing  3.  The patient will be able to walk 1/4 to 1/2 mile with pain level < 5/10 Baseline:  Goal status: met 3/18  4.  The patient will have improved trunk flexor and extensor muscle strength to at least 4+/5 needed for lifting medium weight objects such as bird seed and deer corn bags Baseline:  Goal status: met 3/20 5.  Modified Oswestry score improved to 32% indicating improved function with less pain Baseline:  Goal status: met 3/18  6. Be able to walk 1 mile in 30-35 minutes NEW  7. Strength needed for gardening with minimal to no pain, including lifting  NEW  8. Vacumning /dusting with minimal to no pain NEW   PLAN:  PT FREQUENCY: 1x/week  PT DURATION: 8 weeks  PLANNED INTERVENTIONS: 97164- PT Re-evaluation, 97110-Therapeutic exercises, 97530- Therapeutic activity, 97112- Neuromuscular re-education, 97535- Self Care, 86578- Manual therapy, [id number]- Aquatic Therapy, 97014- Electrical stimulation (unattended), [id number]- Electrical stimulation (manual), [id number]- Ultrasound, [id number]- Traction (mechanical), [id number]- Ionotophoresis 4mg /ml Dexamethasone, Patient/Family education, Taping, Dry Needling, Joint mobilization, Spinal manipulation, Spinal mobilization, Cryotherapy, and Moist heat.  PLAN FOR NEXT SESSION: right gluteal strengthening;  DN combined with ES; continue core strength monitoring numbness sx; reduced treatment frequency to 1x/week  Lavinia Sharps, PT 09/14/23 5:06 PM Phone: [phone] Fax: [phone]

## 2023-09-19 ENCOUNTER — Encounter: Payer: Self-pay | Admitting: Physical Therapy

## 2023-09-19 ENCOUNTER — Ambulatory Visit: Admitting: Physical Therapy

## 2023-09-19 DIAGNOSIS — G8929 Other chronic pain: Secondary | ICD-10-CM | POA: Diagnosis not present

## 2023-09-19 DIAGNOSIS — M5441 Lumbago with sciatica, right side: Secondary | ICD-10-CM | POA: Diagnosis not present

## 2023-09-19 DIAGNOSIS — M6281 Muscle weakness (generalized): Secondary | ICD-10-CM | POA: Diagnosis not present

## 2023-09-19 NOTE — Therapy (Signed)
 OUTPATIENT PHYSICAL THERAPY THORACOLUMBAR PROGRESS NOTE   Patient Name: Kristi Alvarado MRN: [id number] DOB:1950-11-28, 73 y.o., female Today's Date: 09/19/2023   END OF SESSION:  PT End of Session - 09/19/23 1442     Visit Number 15    Number of Visits 16    Date for PT Re-Evaluation 10/25/23    Authorization Type [employer] additional visits approved 1 Re-eval plus 8 visits 3/26-5/20    Authorization - Visit Number 3    Authorization - Number of Visits 8    Progress Note Due on Visit 20    PT Start Time 1400    PT Stop Time 1443    PT Time Calculation (min) 43 min    Activity Tolerance Patient tolerated treatment well    Behavior During Therapy WFL for tasks assessed/performed                  Past Medical History:  Diagnosis Date   Allergy    Hypertension    Past Surgical History:  Procedure Laterality Date   BREAST BIOPSY Right    removal benign adenoma right breast   BTL     COLONOSCOPY  10 yrs ago    [place] [facility] exam"   Patient Active Problem List   Diagnosis Date Noted   Primary hypertension 04/18/2023   Hyperlipidemia 10/26/2022   Routine general medical examination at a health care facility 10/31/2014    PCP: Bambi Lever MD  REFERRING PROVIDER: Ulysees Gander DO  REFERRING DIAG: S28.31, G89.29 chronic bil low back pain with right sided sciatica  Rationale for Evaluation and Treatment: Rehabilitation  THERAPY DIAG:  Back pain; weakness  ONSET DATE: end of October  SUBJECTIVE:                                                                                                                                                                                           SUBJECTIVE STATEMENT: Patient reports no pain.    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 Chronic bilateral low back  pain with right-sided sciatica 2. Degeneration of intervertebral disc of lumbar region with discogenic back pain and lower extremity pain 3. Greater trochanteric bursitis of right hip 4. Muscle strain of right gluteal region, subsequent encounter  -Chronic with exacerbation, complicated, subsequent visit - Complicated presentation that is likely multifactorial including lumbar stenosis with radiculopathy and greater trochanteric bursitis/gluteal tendinitis.  Patient continues to experience right posterior hip pain.  Patient did have significant improvement for 4 weeks after greater trochanteric/gluteal tendon insertion CSI performed on 05/17/2023, however all pain returned restarting physical activity.  Discussed that we hope for 3 months or greater relief from CSI - Recommend epidural right sided L4-L5 to evaluate if lumbar stenosis is a primary contributing factor to patient's pain.  Order placed - Patient elected for IM injection of methylprednisone 80 mg/Toradol 60 mg.  Injection given in clinic today and tolerated well. PERTINENT HISTORY:  HTN; mild high BP  PAIN:  Are you having pain? Yes NPRS scale:1/10  tenderness in back/sacral, lower buttock Pain location: tender right low back; numbness right post/lateral and anteriorthigh, right anterior shin spasms, tingling and numbness constant Pain orientation: Right  PAIN TYPE: burning and tingling Pain description: constant and tingling  Aggravating factors: walk fast; lying on left side; sitting Relieving factors: after meds  for 2 hours; right sidelying; take some weight off right leg; tested on Sunday and walked 1/4 mile felt better   PRECAUTIONS: None   WEIGHT BEARING RESTRICTIONS: No  FALLS:  Has patient fallen in last 6 months? No  LIVING ENVIRONMENT: Lives with: lives with their spouse Lives in: House/apartment [address] and older community  Stairs: No  OCCUPATION: retired  Human resources officer: running;   PLOF: Independent takes care of husband (Parkinsons) tries to limit lifting PATIENT GOALS: walk without pain, walk long distances, later return to running; be able to lift 40# bag of birdseed or 50# bag of deer corn (drags)   NEXT MD VISIT: next Tuesday 2/4 OBJECTIVE:  Note: Objective measures were completed at Evaluation unless otherwise noted.  DIAGNOSTIC FINDINGS:  MRI December 2024 IMPRESSION: 1. Severe L4-L5 facet arthrosis with moderate spinal canal stenosis and severe right neural foraminal stenosis. Round, low signal structure extending into the right neural foramen may be a sequestered disc fragment or synovial cyst. 2. Mild L3-L4 spinal canal stenosis.  Bone Density:  mild osteopenia femur? (not in Epic pt to look up findings)  PATIENT SURVEYS:  Modified Oswestry 44%  3/18:  22%   COGNITION: Overall cognitive status: Within functional limits for tasks assessed     SENSATION: Light touch: Impaired right thigh and anterior lower leg  MUSCLE LENGTH: Decreased right hip flexor length in sidelying and prone POSTURE: decreased lumbar lordosis and right iliac crest higher than left; moderate to severe left ankle pronation  PALPATION: Marked tenderness and spasm in right gluteals and piriformis  LUMBAR ROM:  DKTC mild increase but no worse ; prone lying in hip/buttock ; POE less pain; press up less tingling in leg?   AROM eval 2/26 3/25  Flexion 25% limited WFLs Full no pain  Extension 25% limited WFLs Full no pain  Right lateral flexion 10% limited WFLs Full no pain  Left lateral  flexion 10% limited WFLs Full no pain  Right rotation     Left rotation      (Blank rows = not tested)  TRUNK STRENGTH:  Decreased activation of transverse abdominus muscles; abdominals 4-/5; decreased activation of lumbar multifidi; trunk extensors 4-/5 3/18: trunk strength 4/5 3/25:  trunk strength 4/5  LOWER EXTREMITY ROM:   20% reduction in right hip mobility LOWER EXTREMITY MMT:  Able to rise from standard chair without UE assist  MMT Right eval Left eval 3/18 Right 3/25  Hip flexion 4 5 R 4 R 4  Hip extension 4 5 R 4 R 4  Hip abduction 3+ 5 R 4- R 4  Hip adduction      Hip internal rotation      Hip external rotation 4 5    Knee flexion 4 5 4+ 4+  Knee extension 4 5 4+ 4+  Ankle dorsiflexion 3+ 3+ 4 4+  Ankle plantarflexion 3+ 3+ 4 4+  Ankle inversion      Ankle eversion       (Blank rows = not tested)  LUMBAR SPECIAL TESTS:  Negative slump and SLR but + neural tension   FUNCTIONAL TESTS:  SLS 2 sec right/left 5x STS 15.18 sec no UE use 3 MWT 443 feet  pain 8/10 following, increased numbness/tingling  2/17 TUG 8.07 sec  DGI 23/24  EC x 10 sec no problem  2/26: 5x STS 11.22   GAIT:  Comments: decreased gait speed, increased forward trunk flexion, forward head  TREATMENT DATE:09/19/23  10# single leg RDL  2 x10 right/left 10# weight hold at shoulder level with marching 10x right/left  S/L balance reaching to cone on mat x 15 B SL hip ABD into ball at wall 5 sec hold x 10 B Manual therapy: soft tissue mobilization to right lumbar, gluteal and HS musculature Trigger Point Dry Needling Subsequent Treatment: Instructions provided previously at initial dry needling treatment.3 Patient Verbal Consent Given: Yes Education Handout Provided: Yes Muscles Treated:  right lumbar multifidi,right gluteals, right piriformis; right prox HS Electrical Stimulation Performed: Yes, Parameters: 1.5 ma 80 pps 10 min 2 channels Treatment Response/Outcome: decreased  pain Utilized skilled palpation to identify bony landmarks and trigger points.  Able to illicit twitch response and muscle elongation.     TREATMENT DATE:09/14/23  Discussion of gym program and response; recommendations for progression (low resistance/low reps on HS machine, hip abductor/adductor machine) 10# single leg RDL 10x right/left 10# weight hold at shoulder level with marching 10x right/left  Manual therapy: soft tissue mobilization to right lumbar, gluteal and HS musculature Trigger Point Dry Needling Subsequent Treatment: Instructions provided previously at initial dry needling treatment.3 Patient Verbal Consent Given: Yes Education Handout Provided: Yes Muscles Treated:  right lumbar multifidi,right gluteals, right piriformis; right prox HS Electrical Stimulation Performed: Yes, Parameters: 1.5 ma 80 pps 10 min 2 channels Treatment Response/Outcome: decreased pain Utilized skilled palpation to identify bony landmarks and trigger points.  Able to illicit twitch response and muscle elongation.  Soft tissue mobilization to R    TREATMENT DATE:09/07/23  Discussion of gym program and response 9# single leg RDL 10x right/left Squats in front of table 9# 10x 9# weight hold at shoulder level with step taps 10x right/left  Manual therapy: soft tissue mobilization to right lumbar, gluteal and HS musculature Trigger Point Dry Needling Subsequent Treatment: Instructions provided previously at initial dry needling treatment.3 Patient Verbal Consent Given: Yes Education Handout Provided: Yes Muscles Treated:  right lumbar multifidi,right gluteals, right piriformis; right prox HS Electrical Stimulation Performed: Yes, Parameters: 1.5 ma 80 pps 10 min 2 channels Treatment Response/Outcome: decreased pain Utilized skilled palpation to identify bony landmarks and trigger points.  Able to illicit twitch response and muscle elongation.  Soft tissue mobilization to R  PATIENT EDUCATION:  Education details: Educated patient on anatomy and physiology of current symptoms, prognosis, plan of care as well as initial self care strategies to promote recovery Person educated: Patient Education method: Explanation Education comprehension: verbalized understanding  HOME EXERCISE PROGRAM: Access Code: [email or web address] URL: [email or web address] Date: 08/25/2023 Prepared by: Lavinia Sharps  Exercises - Lying Prone  - 1 x daily - 7 x weekly - 1 sets - 10 reps - Prone Press Up On Elbows  - 1 x daily - 7 x weekly - 1 sets - 5 reps - Prone Press Up  - 1 x daily - 7 x weekly - 1 sets - 5-10 reps - Supine March  - 1 x daily - 7 x weekly - 1 sets - 8-10 reps - Hooklying Isometric Hip Flexion with Opposite Arm  - 1 x daily - 7 x weekly - 1 sets - 8-10 reps - 5 hold - Hooklying Clamshell with Resistance  - 1 x daily - 7 x weekly - 1 sets - 8-10 reps - 5 hold - Supine Double Knee to Chest  - 2 x daily - 7 x weekly - 1 sets - 1-10 reps - 0-20 sec hold - Standing Hip Abduction with Counter Support  - 1 x daily - 3 x weekly - 2 sets - 10 reps - Standing Isometric Hip Abduction with Ball on Wall  - 1 x daily - 7 x weekly - 1 sets - 5 reps - 5 hold - Standing Hip Extension with Counter Support  - 1 x daily - 7 x weekly - 1 sets - 5 reps - 5 hold - Lateral Step Up with Counter Support  - 1 x daily - 7 x weekly - 1 sets - 10 reps - Forward T with Counter Support  - 1 x daily - 7 x weekly - 1 sets - 10 reps - Beginner Front Arm Support  - 1 x daily - 7 x weekly - 2 sets - 5 reps ASSESSMENT:  CLINICAL IMPRESSION: Patient continues to report no pain and is progressing with tolerance to walking and gardening. She is not able to return to previous level yet especially with gardening. She was able to walk 1.5 miles in about 40-45 min without difficulty. She continues to get twitch responses in her R lumbar and gluteals, but significantly less than before. Pt continues to  demonstrate potential for improvement and would benefit from continued skilled therapy to address impairments.      OBJECTIVE IMPAIRMENTS: decreased activity tolerance, difficulty walking, decreased ROM, decreased strength, impaired perceived functional ability, increased muscle spasms, impaired flexibility, postural dysfunction, and pain.   ACTIVITY LIMITATIONS: lifting, sitting, standing, sleeping, locomotion level, and caring for others  PARTICIPATION LIMITATIONS: meal prep, cleaning, laundry, interpersonal relationship, and community activity  PERSONAL FACTORS: 1-2 comorbidities: HTN, osteopenia  are also affecting patient's functional outcome.   REHAB POTENTIAL: Good  CLINICAL DECISION MAKING: Stable/uncomplicated  EVALUATION COMPLEXITY: Low   GOALS: Goals reviewed with patient? Yes  SHORT TERM GOALS: Target date: 08/02/2023    The patient will demonstrate knowledge of basic self care strategies and exercises to promote healing  Baseline: Goal status: met 2/26  2.  The patient will report a 30% improvement in pain levels with functional activities which are currently difficult including walking, sitting Baseline:  Goal status: met 2/25 3.  The patient will have improved gait stamina and speed needed to ambulate 800 feet in 6 minutes  Baseline:  Goal status: met 2/25  4.  5x STS improved to 13 sec indicating improved LE strength Baseline:  Goal status: met 2/25    LONG TERM GOALS: Target date: 10/25/2023   The patient will be independent in a safe self progression of a home exercise program to promote further recovery of function  Baseline:  Goal status: ongoing  2.  The patient will report a 70% improvement in pain levels with functional activities which are currently difficult including walking, standing, sitting Baseline:  Goal status: MET 09/19/23  3.  The patient will be able to walk 1/4 to 1/2 mile with pain level < 5/10 Baseline:  Goal status: met  3/18  4.  The patient will have improved trunk flexor and extensor muscle strength to at least 4+/5 needed for lifting medium weight objects such as bird seed and deer corn bags Baseline:  Goal status: met 3/20 5.  Modified Oswestry score improved to 32% indicating improved function with less pain Baseline:  Goal status: met 3/18  6. Be able to walk 1 mile in 30-35 minutes NEW PARTIALLY MET 09/19/23 Is not quite to frequency she was previously  7. Strength needed for gardening with minimal to no pain, including lifting  NEW  8. Vacumning /dusting with minimal to no pain NEW   PLAN:  PT FREQUENCY: 1x/week  PT DURATION: 8 weeks  PLANNED INTERVENTIONS: 97164- PT Re-evaluation, 97110-Therapeutic exercises, 97530- Therapeutic activity, 97112- Neuromuscular re-education, 97535- Self Care, 16109- Manual therapy, [id number]- Aquatic Therapy, 97014- Electrical stimulation (unattended), [id number]- Electrical stimulation (manual), [id number]- Ultrasound, [id number]- Traction (mechanical), [id number]- Ionotophoresis 4mg /ml Dexamethasone, Patient/Family education, Taping, Dry Needling, Joint mobilization, Spinal manipulation, Spinal mobilization, Cryotherapy, and Moist heat.  PLAN FOR NEXT SESSION: right gluteal strengthening;  DN combined with ES; continue core strength monitoring numbness sx; reduced treatment frequency to 1x/week  Jinx Mourning, PT  09/19/23 5:36 PM Phone: [phone] Fax: [phone]

## 2023-09-22 ENCOUNTER — Encounter: Admitting: Physical Therapy

## 2023-09-29 ENCOUNTER — Ambulatory Visit: Admitting: Physical Therapy

## 2023-09-29 DIAGNOSIS — G8929 Other chronic pain: Secondary | ICD-10-CM

## 2023-09-29 DIAGNOSIS — M6281 Muscle weakness (generalized): Secondary | ICD-10-CM

## 2023-09-29 DIAGNOSIS — M5441 Lumbago with sciatica, right side: Secondary | ICD-10-CM | POA: Diagnosis not present

## 2023-09-29 NOTE — Therapy (Signed)
 OUTPATIENT PHYSICAL THERAPY THORACOLUMBAR PROGRESS NOTE   Patient Name: Kristi Alvarado MRN: [id number] DOB:Feb 26, 1951, 73 y.o., female Today's Date: 09/29/2023   END OF SESSION:  PT End of Session - 09/29/23 1238     Visit Number 16    Date for PT Re-Evaluation 10/25/23    Authorization Type [employer] additional visits approved 1 Re-eval plus 8 visits 3/26-5/20    Authorization - Visit Number 4    Authorization - Number of Visits 8    Progress Note Due on Visit 20    PT Start Time 1237    PT Stop Time 1320    PT Time Calculation (min) 43 min    Activity Tolerance Patient tolerated treatment well                  Past Medical History:  Diagnosis Date   Allergy    Hypertension    Past Surgical History:  Procedure Laterality Date   BREAST BIOPSY Right    removal benign adenoma right breast   BTL     COLONOSCOPY  10 yrs ago    in [facility]-"[place] exam"   Patient Active Problem List   Diagnosis Date Noted   Primary hypertension 04/18/2023   Hyperlipidemia 10/26/2022   Routine general medical examination at a health care facility 10/31/2014    PCP: Bambi Lever MD  REFERRING PROVIDER: Ulysees Gander DO  REFERRING DIAG: N82.95, G89.29 chronic bil low back pain with right sided sciatica  Rationale for Evaluation and Treatment: Rehabilitation  THERAPY DIAG:  Back pain; weakness  ONSET DATE: end of October  SUBJECTIVE:                                                                                                                                                                                           SUBJECTIVE STATEMENT: I've been up and down ladders this morning and been out. I've had a full week of feeling good.  I've tested myself and did gardening on my hands and knees.  I've picked up heavy bags 50# and flipped them.  Walked 1.25 miles no pain.   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 Chronic bilateral low back pain with right-sided sciatica 2. Degeneration of intervertebral disc of lumbar region with discogenic back pain and lower extremity pain 3. Greater trochanteric bursitis of right hip 4. Muscle strain of right gluteal region, subsequent encounter  -Chronic with exacerbation, complicated, subsequent visit - Complicated presentation that is likely multifactorial including lumbar stenosis with radiculopathy and greater trochanteric bursitis/gluteal tendinitis.  Patient continues to experience right posterior hip pain.  Patient did have significant improvement for 4 weeks after greater trochanteric/gluteal tendon insertion CSI performed on 05/17/2023, however all pain returned restarting physical activity.  Discussed that we hope for 3 months or greater relief from CSI - Recommend epidural right sided L4-L5 to evaluate if lumbar stenosis is a primary contributing factor to patient's pain.  Order placed - Patient elected for IM injection of methylprednisone 80 mg/Toradol  60 mg.  Injection given in clinic today and tolerated well. PERTINENT HISTORY:  HTN; mild high BP  PAIN:  Are you having pain? Yes NPRS scale:0-1/10  tenderness in back/sacral, lower buttock Pain location: tender right low back; numbness right post/lateral and anteriorthigh, right anterior shin spasms, tingling and numbness constant Pain orientation: Right  PAIN TYPE: burning and tingling Pain  description: constant and tingling  Aggravating factors: walk fast; lying on left side; sitting Relieving factors: after meds for 2 hours; right sidelying; take some weight off right leg; tested on Sunday and walked 1/4 mile felt better   PRECAUTIONS: None   WEIGHT BEARING RESTRICTIONS: No  FALLS:  Has patient fallen in last 6 months? No  LIVING ENVIRONMENT: Lives with: lives with their spouse Lives in: House/apartment [address] and older community  Stairs: No  OCCUPATION: retired  Human resources officer: running;   PLOF: Independent takes care of husband (Parkinsons) tries to limit lifting PATIENT GOALS: walk without pain, walk long distances, later return to running; be able to lift 40# bag of birdseed or 50# bag of deer corn (drags)   NEXT MD VISIT: next Tuesday 2/4 OBJECTIVE:  Note: Objective measures were completed at Evaluation unless otherwise noted.  DIAGNOSTIC FINDINGS:  MRI December 2024 IMPRESSION: 1. Severe L4-L5 facet arthrosis with moderate spinal canal stenosis and severe right neural foraminal stenosis. Round, low signal structure extending into the right neural foramen may be a sequestered disc fragment or synovial cyst. 2. Mild L3-L4 spinal canal stenosis.  Bone Density:  mild osteopenia femur? (not in Epic pt to look up findings)  PATIENT SURVEYS:  Modified Oswestry 44%  3/18:  22%   COGNITION: Overall cognitive status: Within functional limits for tasks assessed     SENSATION: Light touch: Impaired right thigh and anterior lower leg  MUSCLE LENGTH: Decreased right hip flexor length in sidelying and prone POSTURE: decreased lumbar lordosis and right iliac crest higher than left; moderate to severe left ankle pronation  PALPATION: Marked tenderness and spasm in right gluteals and piriformis  LUMBAR ROM:  DKTC mild increase but no worse ; prone lying in hip/buttock ; POE less pain; press up less tingling in leg?   AROM eval 2/26 3/25  Flexion 25% limited WFLs  Full no pain  Extension 25% limited WFLs Full no pain  Right lateral flexion 10% limited WFLs Full no pain  Left lateral flexion 10% limited WFLs Full no pain  Right rotation     Left rotation      (Blank rows = not tested)  TRUNK STRENGTH:  Decreased activation of transverse abdominus muscles; abdominals 4-/5; decreased activation of lumbar multifidi; trunk extensors 4-/5 3/18: trunk strength 4/5 3/25:  trunk strength 4/5  LOWER EXTREMITY ROM:   20% reduction in right hip mobility LOWER EXTREMITY MMT:  Able to rise from standard chair without UE assist  MMT Right eval Left eval 3/18 Right 3/25  Hip flexion 4 5 R 4 R 4  Hip extension 4 5 R 4 R 4  Hip abduction 3+ 5 R 4- R 4  Hip adduction      Hip internal rotation      Hip external rotation 4 5    Knee flexion 4 5 4+ 4+  Knee extension 4 5 4+ 4+  Ankle dorsiflexion 3+ 3+ 4 4+  Ankle plantarflexion 3+ 3+ 4 4+  Ankle inversion      Ankle eversion       (Blank rows = not tested)  LUMBAR SPECIAL TESTS:  Negative slump and SLR but + neural tension   FUNCTIONAL TESTS:  SLS 2 sec right/left 5x STS 15.18 sec no UE use 3 MWT 443 feet  pain 8/10 following, increased numbness/tingling  2/17 TUG 8.07 sec  DGI 23/24  EC x 10 sec no problem  2/26: 5x STS 11.22   GAIT:  Comments: decreased gait speed, increased forward trunk flexion, forward head  TREATMENT DATE:09/29/23  Review of current home ex and activity level (gardening) Discussed trial of stationary bike (level, no hills before doing her regular bike) Discussed of gym program  Discussion of tapering of visits and plan for discharge in 2-3 visits Manual therapy: soft tissue mobilization to right lumbar, gluteal and HS musculature Trigger Point Dry Needling Subsequent Treatment: Instructions provided previously at initial dry needling treatment.3 Patient Verbal Consent Given: Yes Education Handout Provided: Yes Muscles Treated:  right lumbar multifidi,right  gluteals, right piriformis; right prox HS Electrical Stimulation Performed: Yes, Parameters: 1.5 ma 80 pps 10 min 2 channels Treatment Response/Outcome: decreased pain Utilized skilled palpation to identify bony landmarks and trigger points.  Able to illicit twitch response and muscle elongation.    TREATMENT DATE:09/19/23  10# single leg RDL  2 x10 right/left 10# weight hold at shoulder level with marching 10x right/left  S/L balance reaching to cone on mat x 15 B SL hip ABD into ball at wall 5 sec hold x 10 B Manual therapy: soft tissue mobilization to right lumbar, gluteal and HS musculature Trigger Point Dry Needling Subsequent Treatment: Instructions provided previously at initial dry needling treatment.3 Patient Verbal Consent Given: Yes Education Handout Provided: Yes Muscles Treated:  right lumbar multifidi,right gluteals, right piriformis; right prox HS Electrical Stimulation Performed: Yes, Parameters: 1.5 ma 80 pps 10 min 2 channels Treatment Response/Outcome: decreased pain Utilized skilled palpation to identify bony landmarks and trigger points.  Able to illicit twitch response and muscle elongation.     TREATMENT DATE:09/14/23  Discussion of gym program and response; recommendations for progression (low resistance/low reps on HS machine, hip abductor/adductor machine) 10# single leg RDL 10x right/left 10# weight hold at shoulder level with marching 10x right/left  Manual therapy: soft tissue mobilization to right lumbar, gluteal and HS musculature Trigger Point Dry Needling Subsequent Treatment: Instructions provided previously at initial dry needling treatment.3 Patient Verbal Consent Given: Yes Education Handout Provided: Yes Muscles Treated:  right lumbar multifidi,right gluteals, right piriformis; right prox HS Electrical Stimulation Performed: Yes, Parameters: 1.5 ma 80 pps 10 min 2 channels Treatment Response/Outcome: decreased pain Utilized skilled  palpation to  identify bony landmarks and trigger points.  Able to illicit twitch response and muscle elongation.  Soft tissue mobilization to R    TREATMENT DATE:09/07/23  Discussion of gym program and response 9# single leg RDL 10x right/left Squats in front of table 9# 10x 9# weight hold at shoulder level with step taps 10x right/left  Manual therapy: soft tissue mobilization to right lumbar, gluteal and HS musculature Trigger Point Dry Needling Subsequent Treatment: Instructions provided previously at initial dry needling treatment.3 Patient Verbal Consent Given: Yes Education Handout Provided: Yes Muscles Treated:  right lumbar multifidi,right gluteals, right piriformis; right prox HS Electrical Stimulation Performed: Yes, Parameters: 1.5 ma 80 pps 10 min 2 channels Treatment Response/Outcome: decreased pain Utilized skilled palpation to identify bony landmarks and trigger points.  Able to illicit twitch response and muscle elongation.  Soft tissue mobilization to R                               PATIENT EDUCATION:  Education details: Educated patient on anatomy and physiology of current symptoms, prognosis, plan of care as well as initial self care strategies to promote recovery Person educated: Patient Education method: Explanation Education comprehension: verbalized understanding  HOME EXERCISE PROGRAM: Access Code: [email or web address] URL: [email or web address] Date: 08/25/2023 Prepared by: Darien Eden  Exercises - Lying Prone  - 1 x daily - 7 x weekly - 1 sets - 10 reps - Prone Press Up On Elbows  - 1 x daily - 7 x weekly - 1 sets - 5 reps - Prone Press Up  - 1 x daily - 7 x weekly - 1 sets - 5-10 reps - Supine March  - 1 x daily - 7 x weekly - 1 sets - 8-10 reps - Hooklying Isometric Hip Flexion with Opposite Arm  - 1 x daily - 7 x weekly - 1 sets - 8-10 reps - 5 hold - Hooklying Clamshell with Resistance  - 1 x daily - 7 x weekly - 1 sets - 8-10 reps - 5 hold - Supine Double Knee  to Chest  - 2 x daily - 7 x weekly - 1 sets - 1-10 reps - 0-20 sec hold - Standing Hip Abduction with Counter Support  - 1 x daily - 3 x weekly - 2 sets - 10 reps - Standing Isometric Hip Abduction with Ball on Wall  - 1 x daily - 7 x weekly - 1 sets - 5 reps - 5 hold - Standing Hip Extension with Counter Support  - 1 x daily - 7 x weekly - 1 sets - 5 reps - 5 hold - Lateral Step Up with Counter Support  - 1 x daily - 7 x weekly - 1 sets - 10 reps - Forward T with Counter Support  - 1 x daily - 7 x weekly - 1 sets - 10 reps - Beginner Front Arm Support  - 1 x daily - 7 x weekly - 2 sets - 5 reps ASSESSMENT:  CLINICAL IMPRESSION: Brentley continues to have decreasing symptoms and a gradual return to physical activity.  She is pleased that she has been able to do some gardening (even on hands and knees) and moving heavier bags without symptom exacerbation.  She has increased her walking distance with success as well.  She is responding very well with manual therapy and DN/ES combo.  Will follow up in 2 weeks and if she is still  doing well will continue to taper visits and discharge in 2-3 visits.    OBJECTIVE IMPAIRMENTS: decreased activity tolerance, difficulty walking, decreased ROM, decreased strength, impaired perceived functional ability, increased muscle spasms, impaired flexibility, postural dysfunction, and pain.   ACTIVITY LIMITATIONS: lifting, sitting, standing, sleeping, locomotion level, and caring for others  PARTICIPATION LIMITATIONS: meal prep, cleaning, laundry, interpersonal relationship, and community activity  PERSONAL FACTORS: 1-2 comorbidities: HTN, osteopenia  are also affecting patient's functional outcome.   REHAB POTENTIAL: Good  CLINICAL DECISION MAKING: Stable/uncomplicated  EVALUATION COMPLEXITY: Low   GOALS: Goals reviewed with patient? Yes  SHORT TERM GOALS: Target date: 08/02/2023    The patient will demonstrate knowledge of basic self care strategies and  exercises to promote healing  Baseline: Goal status: met 2/26  2.  The patient will report a 30% improvement in pain levels with functional activities which are currently difficult including walking, sitting Baseline:  Goal status: met 2/25 3.  The patient will have improved gait stamina and speed needed to ambulate 800 feet in 6 minutes  Baseline:  Goal status: met 2/25  4.  5x STS improved to 13 sec indicating improved LE strength Baseline:  Goal status: met 2/25    LONG TERM GOALS: Target date: 10/25/2023   The patient will be independent in a safe self progression of a home exercise program to promote further recovery of function  Baseline:  Goal status: ongoing  2.  The patient will report a 70% improvement in pain levels with functional activities which are currently difficult including walking, standing, sitting Baseline:  Goal status: MET 09/19/23  3.  The patient will be able to walk 1/4 to 1/2 mile with pain level < 5/10 Baseline:  Goal status: met 3/18  4.  The patient will have improved trunk flexor and extensor muscle strength to at least 4+/5 needed for lifting medium weight objects such as bird seed and deer corn bags Baseline:  Goal status: met 3/20 5.  Modified Oswestry score improved to 32% indicating improved function with less pain Baseline:  Goal status: met 3/18  6. Be able to walk 1 mile in 30-35 minutes NEW PARTIALLY MET 09/19/23 Is not quite to frequency she was previously  7. Strength needed for gardening with minimal to no pain, including lifting  NEW  8. Vacumning /dusting with minimal to no pain NEW   PLAN:  PT FREQUENCY: 1x/week  PT DURATION: 8 weeks  PLANNED INTERVENTIONS: 97164- PT Re-evaluation, 97110-Therapeutic exercises, 97530- Therapeutic activity, 97112- Neuromuscular re-education, 97535- Self Care, 16109- Manual therapy, [id number]- Aquatic Therapy, 97014- Electrical stimulation (unattended), [id number]- Electrical stimulation  (manual), [id number]- Ultrasound, [id number]- Traction (mechanical), [id number]- Ionotophoresis 4mg /ml Dexamethasone, Patient/Family education, Taping, Dry Needling, Joint mobilization, Spinal manipulation, Spinal mobilization, Cryotherapy, and Moist heat.  PLAN FOR NEXT SESSION: follow up in 2 weeks;  right gluteal strengthening;  DN combined with ES; continue core strength monitoring numbness sx  Darien Eden, PT 09/29/23 9:49 PM Phone: [phone] Fax: [phone]

## 2023-10-05 ENCOUNTER — Encounter: Admitting: Physical Therapy

## 2023-10-05 NOTE — Progress Notes (Unsigned)
 Cardiology Clinic Note   Patient Name: Kristi Alvarado Date of Encounter: 10/11/2023  Primary Care Provider:  Adelia Homestead, MD Primary Cardiologist:  Kardie Tobb, DO  Patient Profile    Kristi Alvarado 73 year old female presents to the clinic today for follow-up evaluation of her hypertension and hyperlipidemia.  Past Medical History    Past Medical History:  Diagnosis Date   Allergy    Hypertension    Past Surgical History:  Procedure Laterality Date   BREAST BIOPSY Right    removal benign adenoma right breast   BTL     COLONOSCOPY  10 yrs ago    [facility]"    Allergies  No Known Allergies  History of Present Illness    Kristi Alvarado has PMH of hyperlipidemia, hypertension, back pain, and degenerative lumbar disc.  She was seen in follow-up by Dr. Emmette Harms on 09/01/2023.  During that time she reported that she was feeling better.  Her pain was better controlled.  She had been increasing her physical activity.  She noted that her blood pressure was somewhat elevated.  On evaluation it was noted to be 156/80.  Her amlodipine  was increased to 5 mg daily.  She presents to the clinic today for follow-up evaluation and states she has family stress that she has been dealing with.  She has recently placed her husband who has Parkinson's in a nursing facility.  She has a niece that is not doing well post heart transplant and her sister has been diagnosed with dementia.  Her blood pressure initially today is 150 systolic and on recheck is 142/80.  We reviewed secondary causes of hypertension.  She remains very physically active.  She continues to do physical therapy for her back.  Her back is feeling much better.  I will start valsartan 40 mg daily and order a BMP in 1 week we will plan follow-up as scheduled with Dr. Emmette Harms..  Today she denies chest pain, shortness of breath, lower extremity edema, fatigue, palpitations, melena, hematuria, hemoptysis,  diaphoresis, weakness, presyncope, syncope, orthopnea, and PND.    Home Medications    Prior to Admission medications   Medication Sig Start Date End Date Taking? Authorizing Provider  amLODipine  (NORVASC ) 5 MG tablet Take 1 tablet (5 mg total) by mouth daily. 09/01/23 11/30/23  Tobb, Kardie, DO  BIOTIN 5000 PO Take 1 tablet by mouth daily.    [provider]  clonazePAM (KLONOPIN) 0.5 MG tablet Take 0.5 mg by mouth at bedtime as needed.    [provider]  fexofenadine (ALLEGRA) 180 MG tablet Take 1 tablet by mouth daily.    [provider]  fluticasone (FLONASE) 50 MCG/ACT nasal spray Place 1 spray into the nose daily.    [provider]  glucosamine-chondroitin 500-400 MG tablet Take 1 tablet by mouth 3 (three) times daily.    [provider]  meloxicam  (MOBIC ) 15 MG tablet Take 1 tablet (15 mg total) by mouth daily. 04/28/23   Ulysees Gander, DO  methocarbamol  (ROBAXIN ) 500 MG tablet Take 1 tablet (500 mg total) by mouth every 8 (eight) hours as needed for muscle spasms. 04/22/23   Henson, Vickie L, NP-C  NON FORMULARY Take 1-3 capsules by mouth 2 (two) times daily. Patient takes 2 capsules in the morning and 1 at night    [provider]  PRESCRIPTION MEDICATION Regenereyes 1drop left eye twice daily    [provider]  sertraline (ZOLOFT) 50 MG tablet  sertraline 50 mg tablet  TAKE 1 TABLET BY MOUTH EVERY DAY FOR 30 DAYS 06/16/21   [provider]    Family History    Family History  Problem Relation Age of Onset   Heart disease Mother    Heart disease Father    Cancer Maternal Grandmother        colon   Colon cancer Maternal Grandmother    Esophageal cancer Neg Hx    Stomach cancer Neg Hx    Breast cancer Neg Hx    She indicated that her mother is deceased. She indicated that her father is deceased. She indicated that the status of her maternal grandmother is unknown. She indicated that the status of her  neg hx is unknown.  Social History    Social History   Socioeconomic History   Marital status: Married    Spouse name: Not on file   Number of children: Not on file   Years of education: Not on file   Highest education level: Not on file  Occupational History   Not on file  Tobacco Use   Smoking status: Never   Smokeless tobacco: Never  Vaping Use   Vaping status: Never Used  Substance and Sexual Activity   Alcohol use: Yes    Alcohol/week: 0.0 standard drinks of alcohol    Comment: social/maybe 2 times per month per pt   Drug use: No   Sexual activity: Not on file  Other Topics Concern   Not on file  Social History Narrative   Not on file   Social Drivers of Health   Financial Resource Strain: Low Risk  (10/11/2022)   Overall Financial Resource Strain (CARDIA)    Difficulty of Paying Living Expenses: Not hard at all  Food Insecurity: No Food Insecurity (10/11/2022)   Hunger Vital Sign    Worried About Running Out of Food in the Last Year: Never true    Ran Out of Food in the Last Year: Never true  Transportation Needs: No Transportation Needs (10/11/2022)   PRAPARE - Administrator, Civil Service (Medical): No    Lack of Transportation (Non-Medical): No  Physical Activity: Sufficiently Active (10/11/2022)   Exercise Vital Sign    Days of Exercise per Week: 5 days    Minutes of Exercise per Session: 60 min  Stress: No Stress Concern Present (10/11/2022)   [employer] of Occupational Health - Occupational Stress Questionnaire    Feeling of Stress : Not at all  Social Connections: Unknown (10/11/2022)   Social Connection and Isolation Panel [NHANES]    Frequency of Communication with Friends and Family: More than three times a week    Frequency of Social Gatherings with Friends and Family: Once a week    Attends Religious Services: Patient declined    Database administrator or Organizations: Patient declined    Attends Banker Meetings: Patient  declined    Marital Status: Married  Catering manager Violence: Not At Risk (10/11/2022)   Humiliation, Afraid, Rape, and Kick questionnaire    Fear of Current or Ex-Partner: No    Emotionally Abused: No    Physically Abused: No    Sexually Abused: No     Review of Systems    General:  No chills, fever, night sweats or weight changes.  Cardiovascular:  No chest pain, dyspnea on exertion, edema, orthopnea, palpitations, paroxysmal nocturnal dyspnea. Dermatological: No rash, lesions/masses Respiratory: No cough, dyspnea Urologic: No hematuria, dysuria Abdominal:  No nausea, vomiting, diarrhea, bright red blood per rectum, melena, or hematemesis Neurologic:  No visual changes, wkns, changes in mental status. All other systems reviewed and are otherwise negative except as noted above.  Physical Exam    VS:  BP (!) 142/80   Pulse 76   Ht 5\' 4"  (1.626 m)   Wt 141 lb (64 kg)   SpO2 97%   BMI 24.20 kg/m  , BMI Body mass index is 24.2 kg/m. GEN: Well nourished, well developed, in no acute distress. HEENT: normal. Neck: Supple, no JVD, carotid bruits, or masses. Cardiac: RRR, no murmurs, rubs, or gallops. No clubbing, cyanosis, edema.  Radials/DP/PT 2+ and equal bilaterally.  Respiratory:  Respirations regular and unlabored, clear to auscultation bilaterally. GI: Soft, nontender, nondistended, BS + x 4. MS: no deformity or atrophy. Skin: warm and dry, no rash. Neuro:  Strength and sensation are intact. Psych: Normal affect.  Accessory Clinical Findings    Recent Labs: No results found for requested labs within last 365 days.   Recent Lipid Panel    Component Value Date/Time   CHOL 208 (H) 08/30/2023 0944   TRIG 65 08/30/2023 0944   HDL 66 08/30/2023 0944   CHOLHDL 3.2 08/30/2023 0944   CHOLHDL 3 03/22/2016 1532   VLDL 26.4 03/22/2016 1532   LDLCALC 130 (H) 08/30/2023 0944    HYPERTENSION CONTROL Vitals:   10/11/23 1003 10/11/23 1025  BP: (!) 152/80 (!) 142/80     The patient's blood pressure is elevated above target today.  In order to address the patient's elevated BP: Blood pressure will be monitored at home to determine if medication changes need to be made.; A new medication was prescribed today.       ECG personally reviewed by me today-  none today.    Coronary calcium  scoring 11/30/2022   EXAM: Coronary Calcium  Score   TECHNIQUE: A gated, non-contrast computed tomography scan of the heart was performed using 3mm slice thickness. Axial images were analyzed on a dedicated workstation. Calcium  scoring of the coronary arteries was performed using the Agatston method.   FINDINGS: Coronary Calcium  Score:   Left main: 0   Left anterior descending artery: 315   Left circumflex artery: 17.9   Right coronary artery: 0   Total: 333   Percentile: 86   Pericardium: Normal.   Ascending Aorta: Normal caliber.  Aortic atherosclerosis.   Non-cardiac: See separate report from [facility].   IMPRESSION: Coronary calcium  score of 333. This was 71 percentile for age-, race-, and sex-matched controls.   Aortic atherosclerosis.   RECOMMENDATIONS: Coronary artery calcium  (CAC) score is a strong predictor of incident coronary heart disease (CHD) and provides predictive information beyond traditional risk factors. CAC scoring is reasonable to use in the decision to withhold, postpone, or initiate statin therapy in intermediate-risk or selected borderline-risk asymptomatic adults (age 50-75 years and LDL-C >=70 to <190 mg/dL) who do not have diabetes or established atherosclerotic cardiovascular disease (ASCVD).* In intermediate-risk (10-year ASCVD risk >=7.5% to <20%) adults or selected borderline-risk (10-year ASCVD risk >=5% to <7.5%) adults in whom a CAC score is measured for the purpose of making a treatment decision the following recommendations have been made:   If CAC=0, it is reasonable to withhold statin therapy  and reassess in 5 to 10 years, as long as higher risk conditions are absent (diabetes mellitus, family history of premature CHD in first degree relatives (males <55 years; females <65 years), cigarette smoking, or LDL >=190 mg/dL).  If CAC is 1 to 99, it is reasonable to initiate statin therapy for patients >=69 years of age.   If CAC is >=100 or >=75th percentile, it is reasonable to initiate statin therapy at any age.   Cardiology referral should be considered for patients with CAC scores >=400 or >=75th percentile.     Assessment & Plan   1.  Essential hypertension-BP today 142/80 Maintain blood pressure log Heart healthy low-sodium diet Maintain physical activity Continue amlodipine  Start Valsartan 40 mg daily BMP in 1 week Mindfulness stress reduction sheet  Hyperlipidemia-LDL 88 on last evaluation. High-fiber diet Maintain physical activity  Back pain-continues to improve with core stabilizing exercises from physical therapy. Continue physical therapy/core stability exercises Follows with PCP  Disposition: Follow-up with Dr. Emmette Harms or me in 3-4 months.   Chet Cota. Posey Petrik NP-C     10/11/2023, 10:25 AM [facility] [address] Office [phone] Fax [phone]    I spent 14 minutes examining this patient, reviewing medications, and using patient centered shared decision making involving their cardiac care.   I spent  20 minutes reviewing past medical history,  medications, and prior cardiac tests.

## 2023-10-10 ENCOUNTER — Encounter (HOSPITAL_BASED_OUTPATIENT_CLINIC_OR_DEPARTMENT_OTHER): Payer: Self-pay

## 2023-10-11 ENCOUNTER — Ambulatory Visit: Attending: General Practice | Admitting: General Practice

## 2023-10-11 ENCOUNTER — Encounter: Payer: Self-pay | Admitting: General Practice

## 2023-10-11 VITALS — BP 142/80 | HR 76 | Ht 64.0 in | Wt 141.0 lb

## 2023-10-11 DIAGNOSIS — M545 Low back pain, unspecified: Secondary | ICD-10-CM | POA: Diagnosis not present

## 2023-10-11 DIAGNOSIS — I10 Essential (primary) hypertension: Secondary | ICD-10-CM

## 2023-10-11 DIAGNOSIS — E782 Mixed hyperlipidemia: Secondary | ICD-10-CM

## 2023-10-11 DIAGNOSIS — G8929 Other chronic pain: Secondary | ICD-10-CM

## 2023-10-11 MED ORDER — VALSARTAN 40 MG PO TABS: 40.0000 mg | ORAL_TABLET | Freq: Every day | ORAL | 3 refills | Status: DC

## 2023-10-11 NOTE — Patient Instructions (Addendum)
 Medication Instructions:  Start Valsartan 40mg . Take one tablet daily. This medication has been sent to your pharmacy. *If you need a refill on your cardiac medications before your next appointment, please call your pharmacy*   Lab Work: Return for Labs IN ONE WEEK, BMP If you have labs (blood work) drawn today and your tests are completely normal, you will receive your results only by: MyChart Message (if you have MyChart) OR A paper copy in the mail If you have any lab test that is abnormal or we need to change your treatment, we will call you to review the results.   Testing/Procedures: No procedures were ordered during today's visit.    Follow-Up: At [facility], you and your health needs are our priority.  As part of our continuing mission to provide you with exceptional heart care, we have created designated Provider Care Teams.  These Care Teams include your primary Cardiologist (physician) and Advanced Practice Providers (APPs -  Physician Assistants and Nurse Practitioners) who all work together to provide you with the care you need, when you need it.  We recommend signing up for the patient portal called "MyChart".  Sign up information is provided on this After Visit Summary.  MyChart is used to connect with patients for Virtual Visits (Telemedicine).  Patients are able to view lab/test results, encounter notes, upcoming appointments, etc.  Non-urgent messages can be sent to your provider as well.   To learn more about what you can do with MyChart, go to [email or web address].    Your next appointment:    KEEP YOUR APPOINTMENT WITH DR. TOBB   Other Instructions

## 2023-10-12 ENCOUNTER — Ambulatory Visit

## 2023-10-12 VITALS — Ht 64.0 in | Wt 141.0 lb

## 2023-10-12 DIAGNOSIS — Z0000 Encounter for general adult medical examination without abnormal findings: Secondary | ICD-10-CM

## 2023-10-12 NOTE — Progress Notes (Cosign Needed Addendum)
 Subjective:   Kristi Alvarado is a 73 y.o. who presents for a Medicare Wellness preventive visit.  Visit Complete: Virtual I connected with  Kristi Alvarado on 10/12/23 by a audio enabled telemedicine application and verified that I am speaking with the correct person using two identifiers.  Patient Location: Home  Provider Location: Home Office  I discussed the limitations of evaluation and management by telemedicine. The patient expressed understanding and agreed to proceed.  Vital Signs: Because this visit was a virtual/telehealth visit, some criteria may be missing or patient reported. Any vitals not documented were not able to be obtained and vitals that have been documented are patient reported.  VideoDeclined- This patient declined Librarian, academic. Therefore the visit was completed with audio only.  Persons Participating in Visit: Patient.  AWV Questionnaire: Yes: Patient Medicare AWV questionnaire was completed by the patient on 10/11/2023; I have confirmed that all information answered by patient is correct and no changes since this date.  Cardiac Risk Factors include: advanced age (>24men, >60 women);hypertension;dyslipidemia     Objective:    Today's Vitals   10/12/23 1346  Weight: 141 lb (64 kg)  Height: 5\' 4"  (1.626 m)   Body mass index is 24.2 kg/m.     10/12/2023    2:30 PM 07/05/2023   12:16 PM 10/11/2022   10:08 AM 07/02/2021    1:06 PM 12/02/2017    8:02 AM  Advanced Directives  Does Patient Have a Medical Advance Directive? Yes Yes Yes Yes Yes  Type of Estate agent of [place];Living will Living will;Healthcare Power of [employer] Power of [place];Living will Living will;Healthcare Power of [employer] Power of Attorney  Does patient want to make changes to medical advance directive?  No - Patient declined  No - Patient declined   Copy of Healthcare Power of Attorney in Chart? No -  copy requested No - copy requested No - copy requested No - copy requested     Current Medications (verified) Outpatient Encounter Medications as of 10/12/2023  Medication Sig   amLODipine  (NORVASC ) 5 MG tablet Take 1 tablet (5 mg total) by mouth daily. (Patient taking differently: Take 10 mg by mouth daily.)   BIOTIN 5000 PO Take 1 tablet by mouth daily.   clonazePAM (KLONOPIN) 0.5 MG tablet Take 0.5 mg by mouth at bedtime as needed.   fexofenadine (ALLEGRA) 180 MG tablet Take 1 tablet by mouth daily.   fluticasone (FLONASE) 50 MCG/ACT nasal spray Place 1 spray into the nose daily.   glucosamine-chondroitin 500-400 MG tablet Take 1 tablet by mouth 3 (three) times daily.   NON FORMULARY Take 1-3 capsules by mouth 2 (two) times daily. Patient takes 2 capsules in the morning and 1 at night   PRESCRIPTION MEDICATION Regenereyes 1drop left eye twice daily   sertraline (ZOLOFT) 50 MG tablet sertraline 50 mg tablet  TAKE 1 TABLET BY MOUTH EVERY DAY FOR 30 DAYS   valsartan  (DIOVAN ) 40 MG tablet Take 1 tablet (40 mg total) by mouth daily.   meloxicam  (MOBIC ) 15 MG tablet Take 1 tablet (15 mg total) by mouth daily. (Patient not taking: Reported on 10/11/2023)   methocarbamol  (ROBAXIN ) 500 MG tablet Take 1 tablet (500 mg total) by mouth every 8 (eight) hours as needed for muscle spasms. (Patient not taking: Reported on 10/11/2023)   No facility-administered encounter medications on file as of 10/12/2023.    Allergies (verified) Patient has no known allergies.   History:  Past Medical History:  Diagnosis Date   Allergy    Hypertension    Past Surgical History:  Procedure Laterality Date   BREAST BIOPSY Right    removal benign adenoma right breast   BTL     [facility]  [facility] [place]   Family History  Problem Relation Age of Onset   Heart disease Mother    Heart disease Father    Cancer Maternal Grandmother        colon   Colon cancer Maternal Grandmother    Esophageal  cancer Neg Hx    Stomach cancer Neg Hx    Breast cancer Neg Hx    Social History   Socioeconomic History   Marital status: Married    Spouse name: Jenell Mirza   Number of children: Not on file   Years of education: Not on file   Highest education level: Associate degree: occupational, Scientist, product/process development, or vocational program  Occupational History   Occupation: RETIRED  Tobacco Use   Smoking status: Never   Smokeless tobacco: Never  Vaping Use   Vaping status: Never Used  Substance and Sexual Activity   Alcohol use: Yes    Alcohol/week: 0.0 standard drinks of alcohol    Comment: social/maybe 2 times per month per pt   Drug use: No   Sexual activity: Not on file  Other Topics Concern   Not on file  Social History Narrative   Lives alone/2025   Social Drivers of Health   Financial Resource Strain: Low Risk  (10/11/2023)   Overall Financial Resource Strain (CARDIA)    Difficulty of Paying Living Expenses: Not hard at all  Food Insecurity: No Food Insecurity (10/11/2023)   Hunger Vital Sign    Worried About Running Out of Food in the Last Year: Never true    Ran Out of Food in the Last Year: Never true  Transportation Needs: No Transportation Needs (10/11/2023)   PRAPARE - Administrator, Civil Service (Medical): No    Lack of Transportation (Non-Medical): No  Physical Activity: Sufficiently Active (10/11/2023)   Exercise Vital Sign    Days of Exercise per Week: 6 days    Minutes of Exercise per Session: 50 min  Stress: No Stress Concern Present (10/11/2023)   [employer] of Occupational Health - Occupational Stress Questionnaire    Feeling of Stress : Only a little  Social Connections: Socially Integrated (10/11/2023)   Social Connection and Isolation Panel [NHANES]    Frequency of Communication with Friends and Family: More than three times a week    Frequency of Social Gatherings with Friends and Family: Twice a week    Attends Religious Services: More than 4 times per  year    Active Member of [employer] or Organizations: Yes    Attends Engineer, structural: More than 4 times per year    Marital Status: Married    Tobacco Counseling Counseling given: Not Answered    Clinical Intake:  Pre-visit preparation completed: Yes  Pain : No/denies pain     BMI - recorded: 24.2 Nutritional Status: BMI of 19-24  Normal Nutritional Risks: None Diabetes: No  Lab Results  Component Value Date   HGBA1C 5.4 03/15/2018     How often do you need to have someone help you when you read instructions, pamphlets, or other written materials from your doctor or pharmacy?: 1 - Never  Interpreter Needed?: No  Information entered by :: Krisinda Giovanni, RMA  Activities of Daily Living     10/11/2023    3:28 PM  In your present state of health, do you have any difficulty performing the following activities:  Hearing? 0  Vision? 0  Difficulty concentrating or making decisions? 0  Walking or climbing stairs? 0  Dressing or bathing? 0  Doing errands, shopping? 0  Preparing Food and eating ? N  Using the Toilet? N  In the past six months, have you accidently leaked urine? N  Do you have problems with loss of bowel control? N  Managing your Medications? N  Managing your Finances? N  Housekeeping or managing your Housekeeping? N    Patient Care Team: Adelia Homestead, MD as PCP - General (Internal Medicine) Tobb, Kardie, DO as PCP - Cardiology (Cardiology) Guinevere Lefevre, OD as Referring Physician (Optometry) Merryl Abraham, MD as Consulting Physician (Obstetrics and Gynecology)  Indicate any recent Medical Services you may have received from other than [facility] providers in the past year (date may be approximate).     Assessment:   This is a routine wellness examination for [place].  Hearing/Vision screen Hearing Screening - Comments:: Denies hearing difficulties   Vision Screening - Comments:: Wears eyeglasses and contact lenses/ Dr. Wyvonna Heidelberg    Goals Addressed             This Visit's Progress    My goal for 2024 is to maintain my health and stay physically active.   On track      Depression Screen     10/12/2023    2:32 PM 04/22/2023    3:38 PM 04/18/2023   11:37 AM 10/26/2022   11:00 AM 10/11/2022   10:37 AM 07/02/2021    1:09 PM 05/27/2020   10:15 AM  PHQ 2/9 Scores  PHQ - 2 Score 0 0 0 0 0 0 0  PHQ- 9 Score 0   2 0      Fall Risk     10/11/2023    3:28 PM 04/22/2023    3:38 PM 04/18/2023   11:37 AM 10/26/2022   10:58 AM 10/11/2022   10:09 AM  Fall Risk   Falls in the past year? 0 0 0 0 0  Number falls in past yr:  0 0 0 0  Injury with Fall? 0 0 0 0 0  Risk for fall due to :  No Fall Risks Impaired balance/gait  No Fall Risks  Follow up Falls evaluation completed;Falls prevention discussed Falls evaluation completed Falls evaluation completed Falls evaluation completed Falls prevention discussed    MEDICARE RISK AT HOME:  Medicare Risk at Home Any stairs in or around the home?: (Patient-Rptd) No Home free of loose throw rugs in walkways, pet beds, electrical cords, etc?: (Patient-Rptd) Yes Adequate lighting in your home to reduce risk of falls?: (Patient-Rptd) Yes Life alert?: (Patient-Rptd) No Use of a cane, walker or w/c?: (Patient-Rptd) No Grab bars in the bathroom?: (Patient-Rptd) Yes Shower chair or bench in shower?: (Patient-Rptd) Yes Elevated toilet seat or a handicapped toilet?: (Patient-Rptd) No  TIMED UP AND GO:  Was the test performed?  No  Cognitive Function: Declined/Normal: No cognitive concerns noted by patient or family. Patient alert, oriented, able to answer questions appropriately and recall recent events. No signs of memory loss or confusion.        10/11/2022   10:10 AM  6CIT Screen  What Year? 0 points  What month? 0 points  What time? 0 points  Count back from 20 0  points  Months in reverse 0 points  Repeat phrase 0 points  Total Score 0 points     Immunizations Immunization History  Administered Date(s) Administered   Fluad Quad(high Dose 65+) 03/25/2020, 03/24/2022   Influenza, High Dose Seasonal PF 03/19/2017, 03/22/2019   Influenza,inj,Quad PF,6+ Mos 03/21/2014   Influenza-Unspecified 02/11/2016, 02/18/2018, 03/22/2019, 03/25/2020, 03/18/2021, 03/22/2023   PFIZER(Purple Top)SARS-COV-2 Vaccination 06/26/2019, 07/17/2019, 03/04/2020, 10/23/2020   PPD Test 05/27/2020, 07/30/2021   Pfizer Covid-19 Vaccine Bivalent Booster 31yrs & up 03/18/2021, 03/22/2023   Pneumococcal Conjugate-13 01/27/2018   Pneumococcal Polysaccharide-23 02/21/2019   Pneumococcal-Unspecified 02/21/2019   Tdap 03/22/2016   Zoster Recombinant(Shingrix ) 01/27/2018, 04/27/2018   Zoster, Live 10/31/2014, 01/27/2018    Screening Tests Health Maintenance  Topic Date Due   COVID-19 Vaccine (8 - 2024-25 season) 05/17/2023   INFLUENZA VACCINE  01/06/2024   Medicare Annual Wellness (AWV)  10/11/2024   MAMMOGRAM  07/07/2025   DTaP/Tdap/Td (2 - Td or Tdap) 03/22/2026   Colonoscopy  12/03/2027   Pneumonia Vaccine 60+ Years old  Completed   DEXA SCAN  Completed   Hepatitis C Screening  Completed   Zoster Vaccines- Shingrix   Completed   HPV VACCINES  Aged Out   Meningococcal B Vaccine  Aged Out    Health Maintenance  Health Maintenance Due  Topic Date Due   COVID-19 Vaccine (8 - 2024-25 season) 05/17/2023   Health Maintenance Items Addressed: See Nurse Notes  Additional Screening:  Vision Screening: Recommended annual ophthalmology exams for early detection of glaucoma and other disorders of the eye.  Dental Screening: Recommended annual dental exams for proper oral hygiene  Community Resource Referral / Chronic Care Management: CRR required this visit?  No   CCM required this visit?  No     Plan:     I have personally reviewed and noted the following in the patient's chart:   Medical and social history Use of alcohol, tobacco or  illicit drugs  Current medications and supplements including opioid prescriptions. Patient is not currently taking opioid prescriptions. Functional ability and status Nutritional status Physical activity Advanced directives List of other physicians Hospitalizations, surgeries, and ER visits in previous 12 months Vitals Screenings to include cognitive, depression, and falls Referrals and appointments  In addition, I have reviewed and discussed with patient certain preventive protocols, quality metrics, and best practice recommendations. A written personalized care plan for preventive services as well as general preventive health recommendations were provided to patient.     Simcha Farrington L Luie Laneve, CMA   10/12/2023   After Visit Summary: (MyChart) Due to this being a telephonic visit, the after visit summary with patients personalized plan was offered to patient via MyChart   Notes: Please refer to Routing Comments.  Medical screening examination/treatment/procedure(s) were performed by non-physician practitioner and as supervising physician I was immediately available for consultation/collaboration.  I agree with above. Adelaide Holy, MD

## 2023-10-12 NOTE — Patient Instructions (Signed)
 Kristi Alvarado , Thank you for taking time to come for your Medicare Wellness Visit. I appreciate your ongoing commitment to your health goals. Please review the following plan we discussed and let me know if I can assist you in the future.   Referrals/Orders/Follow-Ups/Clinician Recommendations: It was nice talking with you today.  Keep up the good work.  This is a list of the screening recommended for you and due dates:  Health Maintenance  Topic Date Due   COVID-19 Vaccine (8 - 2024-25 season) 05/17/2023   Medicare Annual Wellness Visit  10/11/2023   Flu Shot  01/06/2024   Mammogram  07/07/2025   DTaP/Tdap/Td vaccine (2 - Td or Tdap) 03/22/2026   Colon Cancer Screening  12/03/2027   Pneumonia Vaccine  Completed   DEXA scan (bone density measurement)  Completed   Hepatitis C Screening  Completed   Zoster (Shingles) Vaccine  Completed   HPV Vaccine  Aged Out   Meningitis B Vaccine  Aged Out    Advanced directives: (Copy Requested) Please bring a copy of your health care power of attorney and living will to the office to be added to your chart at your convenience. You can mail to [facility] [address] [place], [place] [address] or email to [email or web address]  Next Medicare Annual Wellness Visit scheduled for next year: Yes  Have you seen your provider in the last 6 months (3 months if uncontrolled diabetes)? Yes.  Last OV 04/22/23.

## 2023-10-13 ENCOUNTER — Ambulatory Visit: Attending: Sports Medicine | Admitting: Physical Therapy

## 2023-10-13 DIAGNOSIS — G8929 Other chronic pain: Secondary | ICD-10-CM | POA: Insufficient documentation

## 2023-10-13 DIAGNOSIS — M6281 Muscle weakness (generalized): Secondary | ICD-10-CM | POA: Insufficient documentation

## 2023-10-13 DIAGNOSIS — M5441 Lumbago with sciatica, right side: Secondary | ICD-10-CM | POA: Diagnosis not present

## 2023-10-13 NOTE — Therapy (Signed)
 OUTPATIENT PHYSICAL THERAPY THORACOLUMBAR PROGRESS NOTE   Patient Name: Kristi Alvarado MRN: [id number] DOB:Dec 16, 1950, 73 y.o., female Today's Date: 10/13/2023   END OF SESSION:  PT End of Session - 10/13/23 1226     Visit Number 17    Date for PT Re-Evaluation 10/25/23    Authorization Type [employer] additional visits approved 1 Re-eval plus 8 visits 3/26-5/20    Authorization - Visit Number 5    Authorization - Number of Visits 8    Progress Note Due on Visit 20    PT Start Time 1230    PT Stop Time 1315    PT Time Calculation (min) 45 min    Activity Tolerance Patient tolerated treatment well                  Past Medical History:  Diagnosis Date   Allergy    Hypertension    Past Surgical History:  Procedure Laterality Date   BREAST BIOPSY Right    removal benign adenoma right breast   BTL     COLONOSCOPY  10 yrs ago    in [facility] exam"   Patient Active Problem List   Diagnosis Date Noted   Primary hypertension 04/18/2023   Hyperlipidemia 10/26/2022   Routine general medical examination at a health care facility 10/31/2014    PCP: Bambi Lever MD  REFERRING PROVIDER: Ulysees Gander DO  REFERRING DIAG: W09.81, G89.29 chronic bil low back pain with right sided sciatica  Rationale for Evaluation and Treatment: Rehabilitation  THERAPY DIAG:  Back pain; weakness  ONSET DATE: end of October  SUBJECTIVE:                                                                                                                                                                                           SUBJECTIVE STATEMENT: I've been to the gym 3x with back muscle soreness but not pain.  Hip adduction and abduction machine 25#.  Gardening and yard work going good.  I was on the floor with my 72 yr old grandson on Monday.      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 Chronic bilateral low back pain with right-sided sciatica 2. Degeneration of intervertebral disc of lumbar region with discogenic back pain and lower extremity pain 3. Greater trochanteric bursitis of right hip 4. Muscle strain of right gluteal region, subsequent encounter  -Chronic with exacerbation, complicated, subsequent visit - Complicated presentation that is likely multifactorial including lumbar stenosis with radiculopathy and greater trochanteric bursitis/gluteal tendinitis.  Patient continues to experience right posterior hip pain.  Patient did have significant improvement for 4 weeks after greater trochanteric/gluteal tendon insertion CSI performed on 05/17/2023, however all pain returned restarting physical activity.  Discussed that we hope for 3 months or greater relief from CSI - Recommend epidural right sided L4-L5 to evaluate if lumbar stenosis is a primary contributing factor to patient's pain.  Order placed - Patient elected for IM injection of methylprednisone 80 mg/Toradol  60 mg.  Injection given in clinic today and tolerated well. PERTINENT HISTORY:  HTN; mild high BP  PAIN:  Are you having pain? Yes NPRS scale:0-1/10  tenderness in back/sacral, lower buttock Pain location: tender right low back; numbness right post/lateral and anteriorthigh, right anterior shin spasms, tingling and numbness constant Pain orientation: Right  PAIN TYPE: burning and tingling Pain description: constant and tingling   Aggravating factors: walk fast; lying on left side; sitting Relieving factors: after meds for 2 hours; right sidelying; take some weight off right leg; tested on Sunday and walked 1/4 mile felt better   PRECAUTIONS: None   WEIGHT BEARING RESTRICTIONS: No  FALLS:  Has patient fallen in last 6 months? No  LIVING ENVIRONMENT: Lives with: lives with their spouse Lives in: House/apartment [address] and older community  Stairs: No  OCCUPATION: retired  Human resources officer: running;   PLOF: Independent takes care of husband (Parkinsons) tries to limit lifting PATIENT GOALS: walk without pain, walk long distances, later return to running; be able to lift 40# bag of birdseed or 50# bag of deer corn (drags)   NEXT MD VISIT: next Tuesday 2/4 OBJECTIVE:  Note: Objective measures were completed at Evaluation unless otherwise noted.  DIAGNOSTIC FINDINGS:  MRI December 2024 IMPRESSION: 1. Severe L4-L5 facet arthrosis with moderate spinal canal stenosis and severe right neural foraminal stenosis. Round, low signal structure extending into the right neural foramen may be a sequestered disc fragment or synovial cyst. 2. Mild L3-L4 spinal canal stenosis.  Bone Density:  mild osteopenia femur? (not in Epic pt to look up findings)  PATIENT SURVEYS:  Modified Oswestry 44%  3/18:  22%   COGNITION: Overall cognitive status: Within functional limits for tasks assessed     SENSATION: Light touch: Impaired right thigh and anterior lower leg  MUSCLE LENGTH: Decreased right hip flexor length in sidelying and prone POSTURE: decreased lumbar lordosis and right iliac crest higher than left; moderate to severe left ankle pronation  PALPATION: Marked tenderness and spasm in right gluteals and piriformis  LUMBAR ROM:  DKTC mild increase but no worse ; prone lying in hip/buttock ; POE less pain; press up less tingling in leg?   AROM eval 2/26 3/25  Flexion 25% limited WFLs Full no pain  Extension 25% limited  WFLs Full no pain  Right lateral flexion 10% limited WFLs Full no pain  Left lateral flexion 10% limited WFLs Full no pain  Right rotation     Left rotation      (Blank rows = not tested)  TRUNK STRENGTH:  Decreased activation of transverse abdominus muscles; abdominals 4-/5; decreased activation of lumbar multifidi; trunk extensors 4-/5 3/18: trunk strength 4/5 3/25:  trunk strength 4/5  LOWER EXTREMITY ROM:   20% reduction in right hip mobility LOWER EXTREMITY MMT:  Able to rise from standard chair without UE assist  MMT Right eval Left eval 3/18 Right 3/25  Hip flexion 4 5 R 4 R 4  Hip extension 4 5 R 4 R 4  Hip abduction 3+ 5 R 4- R 4  Hip adduction      Hip internal rotation      Hip external rotation 4 5    Knee flexion 4 5 4+ 4+  Knee extension 4 5 4+ 4+  Ankle dorsiflexion 3+ 3+ 4 4+  Ankle plantarflexion 3+ 3+ 4 4+  Ankle inversion      Ankle eversion       (Blank rows = not tested)  LUMBAR SPECIAL TESTS:  Negative slump and SLR but + neural tension   FUNCTIONAL TESTS:  SLS 2 sec right/left 5x STS 15.18 sec no UE use 3 MWT 443 feet  pain 8/10 following, increased numbness/tingling  2/17 TUG 8.07 sec  DGI 23/24  EC x 10 sec no problem  2/26: 5x STS 11.22   GAIT:  Comments: decreased gait speed, increased forward trunk flexion, forward head  TREATMENT DATE:10/13/23  Review of current home ex and activity level (gardening) Discussed trial of stationary bike 5 minutes and 6 minutes and it went fine Discussed of gym program and considering Elliptical (discussed short strides 2-3 minutes) Discussion of tapering of visits and plan for discharge in 1-2 visits Manual therapy: soft tissue mobilization to right lumbar, gluteal and HS musculature Trigger Point Dry Needling Subsequent Treatment: Instructions provided previously at initial dry needling treatment.3 Patient Verbal Consent Given: Yes Education Handout Provided: Yes Muscles Treated:  right lumbar  multifidi,right gluteals, right piriformis; right prox HS Electrical Stimulation Performed: Yes, Parameters: 1.5 ma 80 pps 15 min 2 channels Treatment Response/Outcome: decreased pain Utilized skilled palpation to identify bony landmarks and trigger points.  Able to illicit twitch response and muscle elongation.    TREATMENT DATE:09/29/23  Review of current home ex and activity level (gardening) Discussed trial of stationary bike (level, no hills before doing her regular bike) Discussed of gym program  Discussion of tapering of visits and plan for discharge in 2-3 visits Manual therapy: soft tissue mobilization to right lumbar, gluteal and HS musculature Trigger Point Dry Needling Subsequent Treatment: Instructions provided previously at initial dry needling treatment.3 Patient Verbal Consent Given: Yes Education Handout Provided: Yes Muscles Treated:  right lumbar multifidi,right gluteals, right piriformis; right prox HS Electrical Stimulation Performed: Yes, Parameters: 1.5 ma 80 pps 10 min 2 channels Treatment Response/Outcome: decreased pain Utilized skilled palpation to identify bony landmarks and trigger points.  Able to illicit twitch response and muscle elongation.    TREATMENT DATE:09/19/23  10# single leg RDL  2 x10 right/left 10# weight hold at shoulder level with marching 10x right/left  S/L balance reaching to cone on mat x 15 B SL hip ABD into ball at wall 5 sec hold x 10 B Manual therapy: soft tissue mobilization to right lumbar, gluteal and HS musculature Trigger Point Dry Needling Subsequent Treatment: Instructions provided previously at initial dry needling treatment.3 Patient Verbal Consent Given: Yes Education Handout Provided: Yes Muscles Treated:  right lumbar multifidi,right gluteals, right piriformis; right prox HS Electrical Stimulation Performed: Yes, Parameters: 1.5 ma 80 pps 10 min  2 channels Treatment Response/Outcome: decreased pain Utilized skilled  palpation to identify bony landmarks and trigger points.  Able to illicit twitch response and muscle elongation.     TREATMENT DATE:09/14/23  Discussion of gym program and response; recommendations for progression (low resistance/low reps on HS machine, hip abductor/adductor machine) 10# single leg RDL 10x right/left 10# weight hold at shoulder level with marching 10x right/left  Manual therapy: soft tissue mobilization to right lumbar, gluteal and HS musculature Trigger Point Dry Needling Subsequent Treatment: Instructions provided previously at initial dry needling treatment.3 Patient Verbal Consent Given: Yes Education Handout Provided: Yes Muscles Treated:  right lumbar multifidi,right gluteals, right piriformis; right prox HS Electrical Stimulation Performed: Yes, Parameters: 1.5 ma 80 pps 10 min 2 channels Treatment Response/Outcome: decreased pain Utilized skilled palpation to identify bony landmarks and trigger points.  Able to illicit twitch response and muscle elongation.  Soft tissue mobilization to R   PATIENT EDUCATION:  Education details: Educated patient on anatomy and physiology of current symptoms, prognosis, plan of care as well as initial self care strategies to promote recovery Person educated: Patient Education method: Explanation Education comprehension: verbalized understanding  HOME EXERCISE PROGRAM: Access Code: [email or web address] URL: [email or web address] Date: 08/25/2023 Prepared by: Darien Eden  Exercises - Lying Prone  - 1 x daily - 7 x weekly - 1 sets - 10 reps - Prone Press Up On Elbows  - 1 x daily - 7 x weekly - 1 sets - 5 reps - Prone Press Up  - 1 x daily - 7 x weekly - 1 sets - 5-10 reps - Supine March  - 1 x daily - 7 x weekly - 1 sets - 8-10 reps - Hooklying Isometric Hip Flexion with Opposite Arm  - 1 x daily - 7 x weekly - 1 sets - 8-10 reps - 5 hold - Hooklying Clamshell with Resistance  - 1 x daily - 7 x weekly - 1 sets - 8-10 reps - 5  hold - Supine Double Knee to Chest  - 2 x daily - 7 x weekly - 1 sets - 1-10 reps - 0-20 sec hold - Standing Hip Abduction with Counter Support  - 1 x daily - 3 x weekly - 2 sets - 10 reps - Standing Isometric Hip Abduction with Ball on Wall  - 1 x daily - 7 x weekly - 1 sets - 5 reps - 5 hold - Standing Hip Extension with Counter Support  - 1 x daily - 7 x weekly - 1 sets - 5 reps - 5 hold - Lateral Step Up with Counter Support  - 1 x daily - 7 x weekly - 1 sets - 10 reps - Forward T with Counter Support  - 1 x daily - 7 x weekly - 1 sets - 10 reps - Beginner Front Arm Support  - 1 x daily - 7 x weekly - 2 sets - 5 reps ASSESSMENT:  CLINICAL IMPRESSION: Jaeleen is highly compliant with her HEP and she has continually progressed with exercise at the gym.   The patient benefits significantly from dry needling combined with ES to stimulate underlying myofascial trigger points for management of neuromusculoskeletal pain and assist with return to function.  Therapist educating patient on exercise progression and adjusting treatment parameters throughout session. She is progressing well and anticipate readiness for discharge in 1-2 visits.     OBJECTIVE IMPAIRMENTS: decreased activity tolerance, difficulty walking, decreased ROM, decreased strength, impaired perceived functional ability, increased muscle spasms, impaired  flexibility, postural dysfunction, and pain.   ACTIVITY LIMITATIONS: lifting, sitting, standing, sleeping, locomotion level, and caring for others  PARTICIPATION LIMITATIONS: meal prep, cleaning, laundry, interpersonal relationship, and community activity  PERSONAL FACTORS: 1-2 comorbidities: HTN, osteopenia are also affecting patient's functional outcome.   REHAB POTENTIAL: Good  CLINICAL DECISION MAKING: Stable/uncomplicated  EVALUATION COMPLEXITY: Low   GOALS: Goals reviewed with patient? Yes  SHORT TERM GOALS: Target date: 08/02/2023    The patient will demonstrate  knowledge of basic self care strategies and exercises to promote healing  Baseline: Goal status: met 2/26  2.  The patient will report a 30% improvement in pain levels with functional activities which are currently difficult including walking, sitting Baseline:  Goal status: met 2/25 3.  The patient will have improved gait stamina and speed needed to ambulate 800 feet in 6 minutes  Baseline:  Goal status: met 2/25  4.  5x STS improved to 13 sec indicating improved LE strength Baseline:  Goal status: met 2/25    LONG TERM GOALS: Target date: 10/25/2023   The patient will be independent in a safe self progression of a home exercise program to promote further recovery of function  Baseline:  Goal status: ongoing  2.  The patient will report a 70% improvement in pain levels with functional activities which are currently difficult including walking, standing, sitting Baseline:  Goal status: MET 09/19/23  3.  The patient will be able to walk 1/4 to 1/2 mile with pain level < 5/10 Baseline:  Goal status: met 3/18  4.  The patient will have improved trunk flexor and extensor muscle strength to at least 4+/5 needed for lifting medium weight objects such as bird seed and deer corn bags Baseline:  Goal status: met 3/20 5.  Modified Oswestry score improved to 32% indicating improved function with less pain Baseline:  Goal status: met 3/18  6. Be able to walk 1 mile in 30-35 minutes NEW PARTIALLY MET 09/19/23 Is not quite to frequency she was previously  7. Strength needed for gardening with minimal to no pain, including lifting  NEW  8. Vacumning /dusting with minimal to no pain NEW   PLAN:  PT FREQUENCY: 1x/week  PT DURATION: 8 weeks  PLANNED INTERVENTIONS: 97164- PT Re-evaluation, 97110-Therapeutic exercises, 97530- Therapeutic activity, 97112- Neuromuscular re-education, 97535- Self Care, [id number]- Manual therapy, [id number]- Aquatic Therapy, 97014- Electrical stimulation  (unattended), [id number]- Electrical stimulation (manual), [id number]- Ultrasound, [id number]- Traction (mechanical), [id number]- Ionotophoresis 4mg /ml Dexamethasone, Patient/Family education, Taping, Dry Needling, Joint mobilization, Spinal manipulation, Spinal mobilization, Cryotherapy, and Moist heat.  PLAN FOR NEXT SESSION:  right gluteal strengthening;  DN combined with ES; continue core strength monitoring numbness sx; anticipate readiness for discharge in 1-2 visits  Darien Eden, PT 10/13/23 1:20 PM Phone: [phone] Fax: [phone]

## 2023-10-18 ENCOUNTER — Ambulatory Visit: Admitting: Physical Therapy

## 2023-10-18 DIAGNOSIS — G8929 Other chronic pain: Secondary | ICD-10-CM | POA: Diagnosis not present

## 2023-10-18 DIAGNOSIS — M5441 Lumbago with sciatica, right side: Secondary | ICD-10-CM | POA: Diagnosis not present

## 2023-10-18 DIAGNOSIS — M6281 Muscle weakness (generalized): Secondary | ICD-10-CM | POA: Diagnosis not present

## 2023-10-18 NOTE — Therapy (Signed)
 OUTPATIENT PHYSICAL THERAPY THORACOLUMBAR PROGRESS NOTE/DISCHARGE SUMMARY   Patient Name: Kristi Alvarado MRN: [id number] DOB:31-May-1951, 73 y.o., female Today's Date: 10/18/2023   END OF SESSION:  PT End of Session - 10/18/23 1101     Visit Number 18    Date for PT Re-Evaluation 10/25/23    Authorization Type [employer] additional visits approved 1 Re-eval plus 8 visits 3/26-5/20    Authorization - Visit Number 6    Authorization - Number of Visits 8    Progress Note Due on Visit 20    PT Start Time 1102    PT Stop Time 1144    PT Time Calculation (min) 42 min    Activity Tolerance Patient tolerated treatment well                  Past Medical History:  Diagnosis Date   Allergy    Hypertension    Past Surgical History:  Procedure Laterality Date   BREAST BIOPSY Right    removal benign adenoma right breast   BTL     COLONOSCOPY  10 yrs ago    [place] exam"   Patient Active Problem List   Diagnosis Date Noted   Primary hypertension 04/18/2023   Hyperlipidemia 10/26/2022   Routine general medical examination at a health care facility 10/31/2014    PCP: Bambi Lever MD  REFERRING PROVIDER: Ulysees Gander DO  REFERRING DIAG: N82.95, G89.29 chronic bil low back pain with right sided sciatica  Rationale for Evaluation and Treatment: Rehabilitation  THERAPY DIAG:  Back pain; weakness  ONSET DATE: end of October  SUBJECTIVE:                                                                                                                                                                                           SUBJECTIVE STATEMENT: I went to the gym and added the Elliptical and it went all 2 min forward and 2 min backward and the back and upper body things.  Been going to [place] a lot b/c my niece is not doing well.  A mile and half with no pain 45 min-50 min even hills.  I'm going to try my real bike on level ground.      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 Chronic bilateral low back pain with right-sided sciatica 2. Degeneration of intervertebral disc of lumbar region with discogenic back pain and lower extremity pain 3. Greater trochanteric bursitis of right hip 4. Muscle strain of right gluteal region, subsequent encounter  -Chronic with exacerbation, complicated, subsequent visit - Complicated presentation that is likely multifactorial including lumbar stenosis with radiculopathy and greater trochanteric bursitis/gluteal tendinitis.  Patient continues to experience right posterior hip pain.  Patient did have significant improvement for 4 weeks after greater trochanteric/gluteal tendon insertion CSI performed on 05/17/2023, however all pain returned restarting physical activity.  Discussed that we hope for 3 months or greater relief from CSI - Recommend epidural right sided L4-L5 to evaluate if lumbar stenosis is a primary contributing factor to patient's pain.  Order placed - Patient elected for IM injection of methylprednisone 80 mg/Toradol  60 mg.  Injection given in clinic today and tolerated well. PERTINENT HISTORY:  HTN; mild high BP  PAIN:  Are you having pain? Yes NPRS scale:0/10 feeling good;  tenderness in back/sacral, lower buttock Pain location: tender right low back; numbness right post/lateral and anteriorthigh, right anterior shin spasms,  tingling and numbness constant Pain orientation: Right  PAIN TYPE: burning and tingling Pain description: constant and tingling  Aggravating factors: walk fast; lying on left side; sitting Relieving factors: after meds for 2 hours; right sidelying; take some weight off right leg; tested on Sunday and walked 1/4 mile felt better   PRECAUTIONS: None   WEIGHT BEARING RESTRICTIONS: No  FALLS:  Has patient fallen in last 6 months? No  LIVING ENVIRONMENT: Lives with: lives with their spouse Lives in: House/apartment [address] and older community  Stairs: No  OCCUPATION: retired  Human resources officer: running;   PLOF: Independent takes care of husband (Parkinsons) tries to limit lifting PATIENT GOALS: walk without pain, walk long distances, later return to running; be able to lift 40# bag of birdseed or 50# bag of deer corn (drags)   NEXT MD VISIT: next Tuesday 2/4 OBJECTIVE:  Note: Objective measures were completed at Evaluation unless otherwise noted.  DIAGNOSTIC FINDINGS:  MRI December 2024 IMPRESSION: 1. Severe L4-L5 facet arthrosis with moderate spinal canal stenosis and severe right neural foraminal stenosis. Round, low signal structure extending into the right neural foramen may be a sequestered disc fragment or synovial cyst. 2. Mild L3-L4 spinal canal stenosis.  Bone Density:  mild osteopenia femur? (not in Epic pt to look up findings)  PATIENT SURVEYS:  Modified Oswestry 44%  3/18:  22%  5/13: 0%  COGNITION: Overall cognitive status: Within functional limits for tasks assessed     SENSATION: Light touch: Impaired right thigh and anterior lower leg  MUSCLE LENGTH: Decreased right hip flexor length in sidelying and prone POSTURE: decreased lumbar lordosis and right iliac crest higher than left; moderate to severe left ankle pronation  PALPATION: Marked tenderness and spasm in right gluteals and piriformis  LUMBAR ROM:  DKTC mild increase but no worse ; prone lying in  hip/buttock ; POE less pain; press up less tingling in leg?   AROM eval 2/26 3/25 5/13  Flexion 25% limited WFLs Full no pain Full no pain  Extension 25% limited WFLs Full no pain Full no pain   Right lateral flexion 10%  limited WFLs Full no pain   Left lateral flexion 10% limited WFLs Full no pain   Right rotation      Left rotation       (Blank rows = not tested)  TRUNK STRENGTH:  Decreased activation of transverse abdominus muscles; abdominals 4-/5; decreased activation of lumbar multifidi; trunk extensors 4-/5 3/18: trunk strength 4/5 3/25:  trunk strength 4/5  LOWER EXTREMITY ROM:   20% reduction in right hip mobility LOWER EXTREMITY MMT:  Able to rise from standard chair without UE assist  MMT Right eval Left eval 3/18 Right 3/25 5/13  Hip flexion 4 5 R 4 R 4 5  Hip extension 4 5 R 4 R 4 5  Hip abduction 3+ 5 R 4- R 4 4+ Bil  Hip adduction       Hip internal rotation       Hip external rotation 4 5     Knee flexion 4 5 4+ 4+ 5  Knee extension 4 5 4+ 4+ 5  Ankle dorsiflexion 3+ 3+ 4 4+ 5  Ankle plantarflexion 3+ 3+ 4 4+ 5  Ankle inversion       Ankle eversion        (Blank rows = not tested)  LUMBAR SPECIAL TESTS:  Negative slump and SLR but + neural tension   FUNCTIONAL TESTS:  SLS 2 sec right/left 5x STS 15.18 sec no UE use 3 MWT 443 feet  pain 8/10 following, increased numbness/tingling  2/17 TUG 8.07 sec  DGI 23/24  EC x 10 sec no problem  2/26: 5x STS 11.22  5/13: 5x STS 7.65 no hands   GAIT:  Comments: decreased gait speed, increased forward trunk flexion, forward head  TREATMENT DATE:10/18/23  Review of current home ex and activity level (gardening) Discussed of gym program  SLS Lumbar ROM Modified Oswestry Review of progress toward goals  TREATMENT DATE:10/13/23  Review of current home ex and activity level (gardening) Discussed trial of stationary bike 5 minutes and 6 minutes and it went fine Discussed of gym program and considering  Elliptical (discussed short strides 2-3 minutes) Discussion of tapering of visits and plan for discharge in 1-2 visits Manual therapy: soft tissue mobilization to right lumbar, gluteal and HS musculature Trigger Point Dry Needling Subsequent Treatment: Instructions provided previously at initial dry needling treatment.3 Patient Verbal Consent Given: Yes Education Handout Provided: Yes Muscles Treated:  right lumbar multifidi,right gluteals, right piriformis; right prox HS Electrical Stimulation Performed: Yes, Parameters: 1.5 ma 80 pps 15 min 2 channels Treatment Response/Outcome: decreased pain Utilized skilled palpation to identify bony landmarks and trigger points.  Able to illicit twitch response and muscle elongation.    TREATMENT DATE:09/29/23  Review of current home ex and activity level (gardening) Discussed trial of stationary bike (level, no hills before doing her regular bike) Discussed of gym program  Discussion of tapering of visits and plan for discharge in 2-3 visits Manual therapy: soft tissue mobilization to right lumbar, gluteal and HS musculature Trigger Point Dry Needling Subsequent Treatment: Instructions provided previously at initial dry needling treatment.3 Patient Verbal Consent Given: Yes Education Handout Provided: Yes Muscles Treated:  right lumbar multifidi,right gluteals, right piriformis; right prox HS Electrical Stimulation Performed: Yes, Parameters: 1.5 ma 80 pps 10 min 2 channels Treatment Response/Outcome: decreased pain Utilized skilled palpation to identify bony landmarks and trigger points.  Able to illicit twitch response and muscle elongation.      PATIENT EDUCATION:  Education details: Educated patient on anatomy and  physiology of current symptoms, prognosis, plan of care as well as initial self care strategies to promote recovery Person educated: Patient Education method: Explanation Education comprehension: verbalized understanding  HOME  EXERCISE PROGRAM: Access Code: [email or web address] URL: [email or web address] Date: 08/25/2023 Prepared by: Darien Eden  Exercises - Lying Prone  - 1 x daily - 7 x weekly - 1 sets - 10 reps - Prone Press Up On Elbows  - 1 x daily - 7 x weekly - 1 sets - 5 reps - Prone Press Up  - 1 x daily - 7 x weekly - 1 sets - 5-10 reps - Supine March  - 1 x daily - 7 x weekly - 1 sets - 8-10 reps - Hooklying Isometric Hip Flexion with Opposite Arm  - 1 x daily - 7 x weekly - 1 sets - 8-10 reps - 5 hold - Hooklying Clamshell with Resistance  - 1 x daily - 7 x weekly - 1 sets - 8-10 reps - 5 hold - Supine Double Knee to Chest  - 2 x daily - 7 x weekly - 1 sets - 1-10 reps - 0-20 sec hold - Standing Hip Abduction with Counter Support  - 1 x daily - 3 x weekly - 2 sets - 10 reps - Standing Isometric Hip Abduction with Ball on Wall  - 1 x daily - 7 x weekly - 1 sets - 5 reps - 5 hold - Standing Hip Extension with Counter Support  - 1 x daily - 7 x weekly - 1 sets - 5 reps - 5 hold - Lateral Step Up with Counter Support  - 1 x daily - 7 x weekly - 1 sets - 10 reps - Forward T with Counter Support  - 1 x daily - 7 x weekly - 1 sets - 10 reps - Beginner Front Arm Support  - 1 x daily - 7 x weekly - 2 sets - 5 reps ASSESSMENT:  CLINICAL IMPRESSION: The patient has met the majority of rehab goals, with noted improvements in pain reduction, outcome score, ROM, strength and functional mobility.  A comprehensive HEP has been established and anticipate further improvements over time with regular performance of the program.  Recommend discharge from PT at this time.    OBJECTIVE IMPAIRMENTS: decreased activity tolerance, difficulty walking, decreased ROM, decreased strength, impaired perceived functional ability, increased muscle spasms, impaired flexibility, postural dysfunction, and pain.   ACTIVITY LIMITATIONS: lifting, sitting, standing, sleeping, locomotion level, and caring for others  PARTICIPATION  LIMITATIONS: meal prep, cleaning, laundry, interpersonal relationship, and community activity  PERSONAL FACTORS: 1-2 comorbidities: HTN, osteopenia are also affecting patient's functional outcome.   REHAB POTENTIAL: Good  CLINICAL DECISION MAKING: Stable/uncomplicated  EVALUATION COMPLEXITY: Low   GOALS: Goals reviewed with patient? Yes  SHORT TERM GOALS: Target date: 08/02/2023    The patient will demonstrate knowledge of basic self care strategies and exercises to promote healing  Baseline: Goal status: met 2/26  2.  The patient will report a 30% improvement in pain levels with functional activities which are currently difficult including walking, sitting Baseline:  Goal status: met 2/25 3.  The patient will have improved gait stamina and speed needed to ambulate 800 feet in 6 minutes  Baseline:  Goal status: met 2/25  4.  5x STS improved to 13 sec indicating improved LE strength Baseline:  Goal status: met 2/25    LONG TERM GOALS: Target date: 10/25/2023   The patient will be independent in a safe self  progression of a home exercise program to promote further recovery of function  Baseline:  Goal status: met 5/13  2.  The patient will report a 70% improvement in pain levels with functional activities which are currently difficult including walking, standing, sitting Baseline:  Goal status: MET 09/19/23  3.  The patient will be able to walk 1/4 to 1/2 mile with pain level < 5/10 Baseline:  Goal status: met 3/18  4.  The patient will have improved trunk flexor and extensor muscle strength to at least 4+/5 needed for lifting medium weight objects such as bird seed and deer corn bags Baseline:  Goal status: met 3/20 5.  Modified Oswestry score improved to 32% indicating improved function with less pain Baseline:  Goal status: met 3/18  6. Be able to walk 1 mile in 30-35 minutes Met 5/13  7. Strength needed for gardening with minimal to no pain, including  lifting  Met 5/13 including 40# of seed  8. Vacumning /dusting with minimal to no pain Met 5/13   PLAN: PHYSICAL THERAPY DISCHARGE SUMMARY  Visits from Start of Care: 18  Current functional level related to goals / functional outcomes: See clinical impressions above   Remaining deficits: As above   Education / Equipment: HEP   Patient agrees to discharge. Patient goals were met. Patient is being discharged due to meeting the stated rehab goals.  Darien Eden, PT 10/18/23 12:35 PM Phone: [phone] Fax: [phone]

## 2023-10-19 DIAGNOSIS — E782 Mixed hyperlipidemia: Secondary | ICD-10-CM | POA: Diagnosis not present

## 2023-10-19 DIAGNOSIS — I10 Essential (primary) hypertension: Secondary | ICD-10-CM | POA: Diagnosis not present

## 2023-10-19 DIAGNOSIS — M545 Low back pain, unspecified: Secondary | ICD-10-CM | POA: Diagnosis not present

## 2023-10-19 DIAGNOSIS — G8929 Other chronic pain: Secondary | ICD-10-CM | POA: Diagnosis not present

## 2023-10-19 LAB — BASIC METABOLIC PANEL WITH GFR
BUN/Creatinine Ratio: 19 (ref 12–28)
BUN: 13 mg/dL (ref 8–27)
CO2: 23 mmol/L (ref 20–29)
Calcium: 10 mg/dL (ref 8.7–10.3)
Chloride: 104 mmol/L (ref 96–106)
Creatinine, Ser: 0.68 mg/dL (ref 0.57–1.00)
Glucose: 92 mg/dL (ref 70–99)
Potassium: 3.8 mmol/L (ref 3.5–5.2)
Sodium: 143 mmol/L (ref 134–144)
eGFR: 92 mL/min/{1.73_m2} (ref >59)

## 2023-10-20 ENCOUNTER — Ambulatory Visit: Payer: Self-pay | Admitting: General Practice

## 2023-10-25 ENCOUNTER — Encounter: Admitting: Physical Therapy

## 2023-10-27 ENCOUNTER — Encounter: Payer: Medicare PPO | Admitting: Internal Medicine

## 2023-12-13 ENCOUNTER — Encounter: Payer: Self-pay | Admitting: *Deleted

## 2023-12-15 ENCOUNTER — Ambulatory Visit: Attending: Cardiology | Admitting: Cardiology

## 2023-12-15 ENCOUNTER — Encounter: Payer: Self-pay | Admitting: Cardiology

## 2023-12-15 VITALS — BP 140/70 | HR 65 | Ht 63.0 in | Wt 140.0 lb

## 2023-12-15 DIAGNOSIS — E782 Mixed hyperlipidemia: Secondary | ICD-10-CM

## 2023-12-15 DIAGNOSIS — I10 Essential (primary) hypertension: Secondary | ICD-10-CM | POA: Diagnosis not present

## 2023-12-15 NOTE — Patient Instructions (Signed)

## 2023-12-16 NOTE — Progress Notes (Signed)
 Cardiology Office Note:    Date:  12/16/2023   ID:  Kristi Alvarado, DOB 1951/05/05, MRN [id number]  PCP:  Rollene Almarie LABOR, MD  Cardiologist:  Dub Huntsman, DO  Electrophysiologist:  None   Referring MD: Rollene Almarie LABOR, *    I am ok  History of Present Illness:    Kristi Alvarado is a 73 y.o. female with a hx of elevated coronary calcium , Activon score is 333, hypertension, hyperlipidemia , elevated coronary calcium  score here for follow-up visit.  At her last visit with me she was hypertensive and I increase her Amlodipine  to 5 mg daily. She did she Albino Beauvais, NP in may and was hypertensive - therefore valsartan  was added to her regimen.  She offers no complaints today.  Past Medical History:  Diagnosis Date   Allergy    Hypertension     Past Surgical History:  Procedure Laterality Date   BREAST BIOPSY Right    removal benign adenoma right breast   BTL     COLON[facility]COPY  10 yrs ago    [facility]    Current Medications: Current Meds  Medication Sig   amLODipine  (NORVASC ) 5 MG tablet Take 1 tablet (5 mg total) by mouth daily.   BIOTIN 5000 PO Take 1 tablet by mouth daily.   clonazePAM (KLONOPIN) 0.5 MG tablet Take 0.5 mg by mouth at bedtime as needed.   fexofenadine (ALLEGRA) 180 MG tablet Take 1 tablet by mouth daily.   fluticasone (FLONASE) 50 MCG/ACT nasal Alvarado Place 1 Alvarado into the nose daily.   glucosamine-chondroitin 500-400 MG tablet Take 1 tablet by mouth 3 (three) times daily.   NON FORMULARY Take 1-3 capsules by mouth 2 (two) times daily. Patient takes 2 capsules in the morning and 1 at night   PRESCRIPTION MEDICATION Regenereyes 1drop left eye twice daily   sertraline (ZOLOFT) 25 MG tablet Take 25 mg by mouth daily.   sertraline (ZOLOFT) 50 MG tablet sertraline 50 mg tablet  TAKE 1 TABLET BY MOUTH EVERY DAY FOR 30 DAYS   valsartan  (DIOVAN ) 40 MG tablet Take 1 tablet (40 mg total) by mouth daily.     Allergies:   Patient  has no known allergies.   Social History   Socioeconomic History   Marital status: Married    Spouse name: Vonn   Number of children: Not on file   Years of education: Not on file   Highest education level: Associate degree: occupational, Scientist, product/process development, or vocational program  Occupational History   Occupation: RETIRED  Tobacco Use   Smoking status: Never   Smokeless tobacco: Never  Vaping Use   Vaping status: Never Used  Substance and Sexual Activity   Alcohol use: Yes    Alcohol/week: 0.0 standard drinks of alcohol    Comment: social/maybe 2 times per month per pt   Drug use: No   Sexual activity: Not on file  Other Topics Concern   Not on file  Social History Narrative   Lives alone/2025   Social Drivers of Health   Financial Resource Strain: Low Risk  (10/11/2023)   Overall Financial Resource Strain (CARDIA)    Difficulty of Paying Living Expenses: Not hard at all  Food Insecurity: No Food Insecurity (10/11/2023)   Hunger Vital Sign    Worried About Running Out of Food in the Last Year: Never true    Ran Out of Food in the Last Year: Never true  Transportation Needs: No Transportation Needs (10/11/2023)  PRAPARE - Administrator, Civil Service (Medical): No    Lack of Transportation (Non-Medical): No  Physical Activity: Sufficiently Active (10/11/2023)   Exercise Vital Sign    Days of Exercise per Week: 6 days    Minutes of Exercise per Session: 50 min  Stress: No Stress Concern Present (10/11/2023)   [employer] of Occupational Health - Occupational Stress Questionnaire    Feeling of Stress : Only a little  Social Connections: Socially Integrated (10/11/2023)   Social Connection and Isolation Panel    Frequency of Communication with Friends and Family: More than three times a week    Frequency of Social Gatherings with Friends and Family: Twice a week    Attends Religious Services: More than 4 times per year    Active Member of [employer] or Organizations: Yes     Attends Engineer, structural: More than 4 times per year    Marital Status: Married     Family History: The patient's family history includes Cancer in her maternal grandmother; Colon cancer in her maternal grandmother; Heart disease in her father and mother. There is no history of Esophageal cancer, Stomach cancer, or Breast cancer.  ROS:   Review of Systems  Constitution: Negative for decreased appetite, fever and weight gain.  HENT: Negative for congestion, ear discharge, hoarse voice and sore throat.   Eyes: Negative for discharge, redness, vision loss in right eye and visual halos.  Cardiovascular: Negative for chest pain, dyspnea on exertion, leg swelling, orthopnea and palpitations.  Respiratory: Negative for cough, hemoptysis, shortness of breath and snoring.   Endocrine: Negative for heat intolerance and polyphagia.  Hematologic/Lymphatic: Negative for bleeding problem. Does not bruise/bleed easily.  Skin: Negative for flushing, nail changes, rash and suspicious lesions.  Musculoskeletal: Negative for arthritis, joint pain, muscle cramps, myalgias, neck pain and stiffness.  Gastrointestinal: Negative for abdominal pain, bowel incontinence, diarrhea and excessive appetite.  Genitourinary: Negative for decreased libido, genital sores and incomplete emptying.  Neurological: Negative for brief paralysis, focal weakness, headaches and loss of balance.  Psychiatric/Behavioral: Negative for altered mental status, depression and suicidal ideas.  Allergic/Immunologic: Negative for HIV exposure and persistent infections.    EKGs/Labs/Other Studies Reviewed:    The following studies were reviewed today:   EKG:  The ekg ordered today demonstrates sinus rhythm  Recent Labs: 10/19/2023: BUN 13; Creatinine, Ser 0.68; Potassium 3.8; Sodium 143  Recent Lipid Panel    Component Value Date/Time   CHOL 208 (H) 08/30/2023 0944   TRIG 65 08/30/2023 0944   HDL 66 08/30/2023 0944    CHOLHDL 3.2 08/30/2023 0944   CHOLHDL 3 03/22/2016 1532   VLDL 26.4 03/22/2016 1532   LDLCALC 130 (H) 08/30/2023 0944    Physical Exam:    VS:  BP (!) 140/70 (BP Location: Left Arm, Patient Position: Sitting, Cuff Size: Normal)   Pulse 65   Ht 5' 3 (1.6 m)   Wt 140 lb (63.5 kg)   SpO2 99%   BMI 24.80 kg/m     Wt Readings from Last 3 Encounters:  12/15/23 140 lb (63.5 kg)  10/12/23 141 lb (64 kg)  10/11/23 141 lb (64 kg)     GEN: Well nourished, well developed in no acute distress HEENT: Normal NECK: No JVD; No carotid bruits LYMPHATICS: No lymphadenopathy CARDIAC: S1S2 noted,RRR, no murmurs, rubs, gallops RESPIRATORY:  Clear to auscultation without rales, wheezing or rhonchi  ABDOMEN: Soft, non-tender, non-distended, +bowel sounds, no guarding. EXTREMITIES: No edema, No  cyanosis, no clubbing MUSCULOSKELETAL:  No deformity  SKIN: Warm and dry NEUROLOGIC:  Alert and oriented x 3, non-focal PSYCHIATRIC:  Normal affect, good insight  ASSESSMENT:    1. Primary hypertension   2. Mixed hyperlipidemia      PLAN:    Hypertension- her blood pressure at home since the addition of valsartan  has been at target. She shared the records with me. In the office she is slightly elevated but I will not change any medications at this time.   Hyperlipidemia LDL cholesterol level is 88, within target range.  Chronic Pain Chronic pain improved by 90%. Physical therapy effective. - Continue physical therapy sessions.  Exercise and Diet Resumed exercise and improved diet. Positive impact on health expected. - Encourage continuation of exercise and healthy eating habits.   The patient is in agreement with the above plan. The patient left the office in stable condition.  The patient will follow up in   Medication Adjustments/Labs and Tests Ordered: Current medicines are reviewed at length with the patient today.  Concerns regarding medicines are outlined above.  No orders of  the defined types were placed in this encounter.  No orders of the defined types were placed in this encounter.   Patient Instructions  Medication Instructions:  Your physician recommends that you continue on your current medications as directed. Please refer to the Current Medication list given to you today.  *If you need a refill on your cardiac medications before your next appointment, please call your pharmacy*   Follow-Up: At [facility], you and your health needs are our priority.  As part of our continuing mission to provide you with exceptional heart care, our providers are all part of one team.  This team includes your primary Cardiologist (physician) and Advanced Practice Providers or APPs (Physician Assistants and Nurse Practitioners) who all work together to provide you with the care you need, when you need it.  Your next appointment:   9 month(s)  Provider:   Marciel Offenberger, DO      Adopting a Healthy Lifestyle.  Know what a healthy weight is for you (roughly BMI <25) and aim to maintain this   Aim for 7+ servings of fruits and vegetables daily   65-80+ fluid ounces of water or unsweet tea for healthy kidneys   Limit to max 1 drink of alcohol per day; avoid smoking/tobacco   Limit animal fats in diet for cholesterol and heart health - choose grass fed whenever available   Avoid highly processed foods, and foods high in saturated/trans fats   Aim for low stress - take time to unwind and care for your mental health   Aim for 150 min of moderate intensity exercise weekly for heart health, and weights twice weekly for bone health   Aim for 7-9 hours of sleep daily   When it comes to diets, agreement about the perfect plan isnt easy to find, even among the experts. Experts at the [facility] of [employer] developed an idea known as the Healthy Eating Plate. Just imagine a plate divided into logical, healthy portions.   The emphasis is on diet  quality:   Load up on vegetables and fruits - one-half of your plate: Aim for color and variety, and remember that potatoes dont count.   Go for whole grains - one-quarter of your plate: Whole wheat, barley, wheat berries, quinoa, oats, brown rice, and foods made with them. If you want pasta, go with whole wheat pasta.  Protein power - one-quarter of your plate: Fish, chicken, beans, and nuts are all healthy, versatile protein sources. Limit red meat.   The diet, however, does go beyond the plate, offering a few other suggestions.   Use healthy plant oils, such as olive, canola, soy, corn, sunflower and peanut. Check the labels, and avoid partially hydrogenated oil, which have unhealthy trans fats.   If youre thirsty, drink water. Coffee and tea are good in moderation, but skip sugary drinks and limit milk and dairy products to one or two daily servings.   The type of carbohydrate in the diet is more important than the amount. Some sources of carbohydrates, such as vegetables, fruits, whole grains, and beans-are healthier than others.   Finally, stay active  Signed, Dub Huntsman, DO  12/16/2023 10:47 PM    [facility]

## 2023-12-30 ENCOUNTER — Telehealth: Payer: Self-pay | Admitting: Pharmacist

## 2023-12-30 NOTE — Progress Notes (Signed)
 Pharmacy Quality Measure Review  This patient is appearing on a report for being at risk of failing the adherence measure for cholesterol (statin) medications this calendar year.   Medication: Atorvastatin  10 mg Last fill date: 10/29/23 for 90 day supply  Insurance report was not up to date. No action needed at this time.    Darrelyn Drum, PharmD, BCPS, CPP Clinical Pharmacist Practitioner [facility] [phone]

## 2024-01-26 ENCOUNTER — Other Ambulatory Visit: Payer: Self-pay | Admitting: General Practice

## 2024-02-07 DIAGNOSIS — H25042 Posterior subcapsular polar age-related cataract, left eye: Secondary | ICD-10-CM | POA: Diagnosis not present

## 2024-02-07 DIAGNOSIS — H2513 Age-related nuclear cataract, bilateral: Secondary | ICD-10-CM | POA: Diagnosis not present

## 2024-02-07 DIAGNOSIS — H16223 Keratoconjunctivitis sicca, not specified as Sjogren's, bilateral: Secondary | ICD-10-CM | POA: Diagnosis not present

## 2024-02-07 DIAGNOSIS — H25011 Cortical age-related cataract, right eye: Secondary | ICD-10-CM | POA: Diagnosis not present

## 2024-02-10 DIAGNOSIS — L821 Other seborrheic keratosis: Secondary | ICD-10-CM | POA: Diagnosis not present

## 2024-02-10 DIAGNOSIS — L814 Other melanin hyperpigmentation: Secondary | ICD-10-CM | POA: Diagnosis not present

## 2024-02-10 DIAGNOSIS — Z85820 Personal history of malignant melanoma of skin: Secondary | ICD-10-CM | POA: Diagnosis not present

## 2024-02-10 DIAGNOSIS — D225 Melanocytic nevi of trunk: Secondary | ICD-10-CM | POA: Diagnosis not present

## 2024-02-10 DIAGNOSIS — D2261 Melanocytic nevi of right upper limb, including shoulder: Secondary | ICD-10-CM | POA: Diagnosis not present

## 2024-02-10 DIAGNOSIS — Z808 Family history of malignant neoplasm of other organs or systems: Secondary | ICD-10-CM | POA: Diagnosis not present

## 2024-02-10 DIAGNOSIS — D2272 Melanocytic nevi of left lower limb, including hip: Secondary | ICD-10-CM | POA: Diagnosis not present

## 2024-02-10 DIAGNOSIS — L578 Other skin changes due to chronic exposure to nonionizing radiation: Secondary | ICD-10-CM | POA: Diagnosis not present

## 2024-02-10 DIAGNOSIS — Z85828 Personal history of other malignant neoplasm of skin: Secondary | ICD-10-CM | POA: Diagnosis not present

## 2024-04-29 ENCOUNTER — Other Ambulatory Visit: Payer: Self-pay | Admitting: General Practice

## 2024-06-12 ENCOUNTER — Other Ambulatory Visit: Payer: Self-pay | Admitting: Obstetrics and Gynecology

## 2024-06-12 DIAGNOSIS — Z1231 Encounter for screening mammogram for malignant neoplasm of breast: Secondary | ICD-10-CM

## 2024-07-09 ENCOUNTER — Ambulatory Visit

## 2024-07-12 ENCOUNTER — Ambulatory Visit
Admission: RE | Admit: 2024-07-12 | Discharge: 2024-07-12 | Disposition: A | Source: Ambulatory Visit | Attending: Obstetrics and Gynecology | Admitting: Obstetrics and Gynecology

## 2024-07-12 DIAGNOSIS — Z1231 Encounter for screening mammogram for malignant neoplasm of breast: Secondary | ICD-10-CM

## 2024-10-12 ENCOUNTER — Ambulatory Visit
